# Patient Record
Sex: Male | Born: 1970 | Race: Black or African American | Hispanic: No | Marital: Married | Smoking: Never smoker
Health system: Southern US, Community
[De-identification: ages and names within clinical notes are randomized; demographics above are authoritative.]

## PROBLEM LIST (undated history)

## (undated) DIAGNOSIS — I10 Essential (primary) hypertension: Secondary | ICD-10-CM

## (undated) DIAGNOSIS — G4700 Insomnia, unspecified: Secondary | ICD-10-CM

## (undated) DIAGNOSIS — E119 Type 2 diabetes mellitus without complications: Secondary | ICD-10-CM

## (undated) DIAGNOSIS — G4733 Obstructive sleep apnea (adult) (pediatric): Principal | ICD-10-CM

## (undated) DIAGNOSIS — J45909 Unspecified asthma, uncomplicated: Secondary | ICD-10-CM

## (undated) HISTORY — DX: Insomnia, unspecified: G47.00

## (undated) HISTORY — DX: Obstructive sleep apnea (adult) (pediatric): G47.33

## (undated) HISTORY — DX: Essential (primary) hypertension: I10

## (undated) HISTORY — PX: VASECTOMY: SHX75

## (undated) HISTORY — DX: Type 2 diabetes mellitus without complications: E11.9

---

## 2012-09-30 ENCOUNTER — Emergency Department (HOSPITAL_BASED_OUTPATIENT_CLINIC_OR_DEPARTMENT_OTHER)
Admission: EM | Admit: 2012-09-30 | Discharge: 2012-09-30 | Disposition: A | Discharge status: Other Acute Inpatient Hospital | Payer: PRIVATE HEALTH INSURANCE | Attending: Emergency Medicine | Admitting: Emergency Medicine

## 2012-09-30 ENCOUNTER — Encounter (HOSPITAL_BASED_OUTPATIENT_CLINIC_OR_DEPARTMENT_OTHER): Payer: Self-pay | Admitting: *Deleted

## 2012-09-30 ENCOUNTER — Emergency Department (HOSPITAL_BASED_OUTPATIENT_CLINIC_OR_DEPARTMENT_OTHER): Payer: PRIVATE HEALTH INSURANCE

## 2012-09-30 DIAGNOSIS — R0602 Shortness of breath: Secondary | ICD-10-CM | POA: Insufficient documentation

## 2012-09-30 DIAGNOSIS — J189 Pneumonia, unspecified organism: Secondary | ICD-10-CM | POA: Insufficient documentation

## 2012-09-30 DIAGNOSIS — J3489 Other specified disorders of nose and nasal sinuses: Secondary | ICD-10-CM | POA: Insufficient documentation

## 2012-09-30 DIAGNOSIS — R0789 Other chest pain: Secondary | ICD-10-CM | POA: Insufficient documentation

## 2012-09-30 DIAGNOSIS — J45909 Unspecified asthma, uncomplicated: Secondary | ICD-10-CM | POA: Insufficient documentation

## 2012-09-30 DIAGNOSIS — R51 Headache: Secondary | ICD-10-CM | POA: Insufficient documentation

## 2012-09-30 DIAGNOSIS — Z79899 Other long term (current) drug therapy: Secondary | ICD-10-CM | POA: Insufficient documentation

## 2012-09-30 DIAGNOSIS — R509 Fever, unspecified: Secondary | ICD-10-CM | POA: Insufficient documentation

## 2012-09-30 DIAGNOSIS — J029 Acute pharyngitis, unspecified: Secondary | ICD-10-CM | POA: Insufficient documentation

## 2012-09-30 DIAGNOSIS — R6883 Chills (without fever): Secondary | ICD-10-CM | POA: Insufficient documentation

## 2012-09-30 HISTORY — DX: Unspecified asthma, uncomplicated: J45.909

## 2012-09-30 LAB — POCT I-STAT 3, ART BLOOD GAS (G3+)
Acid-base deficit: 1 mmol/L (ref 0.0–2.0)
O2 Saturation: 92 %
pCO2 arterial: 34.2 mmHg — ABNORMAL LOW (ref 35.0–45.0)

## 2012-09-30 LAB — BASIC METABOLIC PANEL
BUN: 9 mg/dL (ref 6–23)
Calcium: 9.7 mg/dL (ref 8.4–10.5)
GFR calc Af Amer: 84 mL/min — ABNORMAL LOW (ref >90)
GFR calc non Af Amer: 73 mL/min — ABNORMAL LOW (ref >90)
Potassium: 3.6 mEq/L (ref 3.5–5.1)
Sodium: 141 mEq/L (ref 135–145)

## 2012-09-30 LAB — CBC WITH DIFFERENTIAL/PLATELET
Basophils Relative: 0 % (ref 0–1)
Eosinophils Absolute: 0.1 10*3/uL (ref 0.0–0.7)
MCH: 28.9 pg (ref 26.0–34.0)
MCHC: 33.8 g/dL (ref 30.0–36.0)
Monocytes Relative: 11 % (ref 3–12)
Neutrophils Relative %: 75 % (ref 43–77)
Platelets: 218 10*3/uL (ref 150–400)

## 2012-09-30 LAB — URINALYSIS, ROUTINE W REFLEX MICROSCOPIC
Bilirubin Urine: NEGATIVE
Ketones, ur: 15 mg/dL — AB
Nitrite: NEGATIVE
Protein, ur: NEGATIVE mg/dL
Specific Gravity, Urine: 1.013 (ref 1.005–1.030)
Urobilinogen, UA: 0.2 mg/dL (ref 0.0–1.0)

## 2012-09-30 MED ORDER — DEXTROSE 5 % IV SOLN
1.0000 g | Freq: Once | INTRAVENOUS | Status: AC
  Administered 2012-09-30: 1 g via INTRAVENOUS
  Filled 2012-09-30: qty 10

## 2012-09-30 MED ORDER — DEXTROSE 5 % IV SOLN
500.0000 mg | Freq: Once | INTRAVENOUS | Status: AC
  Administered 2012-09-30: 500 mg via INTRAVENOUS
  Filled 2012-09-30: qty 500

## 2012-09-30 MED ORDER — ALBUTEROL SULFATE (5 MG/ML) 0.5% IN NEBU
5.0000 mg | INHALATION_SOLUTION | Freq: Once | RESPIRATORY_TRACT | Status: AC
  Administered 2012-09-30: 5 mg via RESPIRATORY_TRACT
  Filled 2012-09-30: qty 1

## 2012-09-30 MED ORDER — SODIUM CHLORIDE 0.9 % IV BOLUS (SEPSIS)
1000.0000 mL | Freq: Once | INTRAVENOUS | Status: AC
  Administered 2012-09-30: 1000 mL via INTRAVENOUS

## 2012-09-30 MED ORDER — IPRATROPIUM BROMIDE 0.02 % IN SOLN
0.5000 mg | Freq: Once | RESPIRATORY_TRACT | Status: AC
  Administered 2012-09-30: 0.5 mg via RESPIRATORY_TRACT
  Filled 2012-09-30: qty 2.5

## 2012-09-30 MED ORDER — PREDNISONE 50 MG PO TABS
60.0000 mg | ORAL_TABLET | Freq: Once | ORAL | Status: AC
  Administered 2012-09-30: 60 mg via ORAL
  Filled 2012-09-30: qty 1

## 2012-09-30 NOTE — ED Notes (Signed)
[facility]

## 2012-09-30 NOTE — ED Provider Notes (Signed)
History  This chart was scribed for Hilario Quarry, MD by Shari Heritage, ED Scribe. The patient was seen in room MH01/MH01. Patient's care was started at 1517.   CSN: [id number]  Arrival date & time 09/30/12  1501   First MD Initiated Contact with Patient 09/30/12 1517      Chief Complaint  Patient presents with  . Cough     Patient is a 42 y.o. male presenting with cough. The history is provided by the patient. No language interpreter was used.  Cough Cough characteristics:  Productive Sputum characteristics:  Yellow and green Severity:  Moderate Duration:  1 week Timing:  Constant Progression:  Unchanged Smoker: no   Relieved by:  Nothing Ineffective treatments:  Steroid inhaler and home nebulizer Associated symptoms: chills, fever (subjective), headaches, rhinorrhea, shortness of breath and sore throat      HPI Comments: Clifford Williamson is a 42 y.o. male with history of asthma who presents to the Emergency Department complaining of persistent moderate cough, shortness of breath and wheezing onset 1 week ago. Cough is productive of yellow-green sputum. There is associated sore throat, subjective fever, chills, headache, chest tightness and rhinorrhea.  He has an albuterol nebulizer at home which he used three times today prior to arrival. Patient also has inhalers at home and states that he usually uses his Qvar inhaler 2-3 times per day because of moderate to severe asthma symptoms. He denies history of COPD. He does not smoke. Patient states that he was last hospitalized in June 2013 for asthma exacerbation. He says that he only recently moved to [facility] from [place] and has never been admitted to area hospitals. Patient denies history of diabetes, DVT, PE, hypertension or high cholesterol.   PCP - [facility]   Past Medical History  Diagnosis Date  . Asthma     History reviewed. No pertinent past surgical history.  History reviewed. No pertinent family history.  History   Substance Use Topics  . Smoking status: Never Smoker   . Smokeless tobacco: Not on file  . Alcohol Use: No      Review of Systems  Constitutional: Positive for fever (subjective) and chills.  HENT: Positive for sore throat and rhinorrhea.   Respiratory: Positive for cough, chest tightness and shortness of breath.   Neurological: Positive for headaches.  All other systems reviewed and are negative.    Allergies  Aspirin  Home Medications   Current Outpatient Rx  Name  Route  Sig  Dispense  Refill  . albuterol (PROVENTIL HFA;VENTOLIN HFA) 108 (90 BASE) MCG/ACT inhaler   Inhalation   Inhale 2 puffs into the lungs every 6 (six) hours as needed for wheezing.         Marland Kitchen albuterol (PROVENTIL) (2.5 MG/3ML) 0.083% nebulizer solution   Nebulization   Take 2.5 mg by nebulization every 6 (six) hours as needed for wheezing.         . beclomethasone (QVAR) 80 MCG/ACT inhaler   Inhalation   Inhale 1 puff into the lungs as needed.         . budesonide-formoterol (SYMBICORT) 160-4.5 MCG/ACT inhaler   Inhalation   Inhale 2 puffs into the lungs 2 (two) times daily.         . mometasone-formoterol (DULERA) 100-5 MCG/ACT AERO   Inhalation   Inhale 2 puffs into the lungs.         . montelukast (SINGULAIR) 10 MG tablet   Oral   Take 10 mg by mouth  at bedtime.         Marland Kitchen zolpidem (AMBIEN) 10 MG tablet   Oral   Take 10 mg by mouth at bedtime as needed for sleep.           Triage Vitals: BP 125/87  Pulse 128  Temp(Src) 98.8 F (37.1 C) (Oral)  Resp 20  Ht 5' 6.5" (1.689 m)  Wt 187 lb (84.823 kg)  BMI 29.73 kg/m2  SpO2 93%  Physical Exam  Constitutional: He is oriented to person, place, and time. He appears well-developed and well-nourished.  HENT:  Head: Normocephalic and atraumatic.  Right Ear: Tympanic membrane, external ear and ear canal normal.  Left Ear: Tympanic membrane, external ear and ear canal normal.  Mouth/Throat: Oropharynx is clear and moist  and mucous membranes are normal. Mucous membranes are not dry.  Eyes: Conjunctivae and EOM are normal. Pupils are equal, round, and reactive to light.  Neck: Normal range of motion. Neck supple.  Cardiovascular: Regular rhythm and normal heart sounds.  Tachycardia present.   No murmur heard. Pulmonary/Chest: Effort normal. He has wheezes (throughout). He has rhonchi (throughout).  Abdominal: Soft.  Musculoskeletal: Normal range of motion.  Neurological: He is alert and oriented to person, place, and time.  Skin: Skin is warm and dry.    ED Course  Procedures (including critical care time) DIAGNOSTIC STUDIES: Oxygen Saturation is 93% on room air, adequate by my interpretation.    COORDINATION OF CARE: 3:29 PM- Will order albuterol neb 5 mg, Atrovent neb solution 0.5 mg, prednisone 60 mg and 1000 mL of IV fluids. Will also order chest x-Fateh Kindle, CBC with diff and BMP. Patient informed of current plan for treatment and evaluation and agrees with plan at this time.   4:17 PM- X-Chari Parmenter showed pneumonia and asthma exacerbation. Am recommending admission for further treatment. Will page hospitalist regarding patient's case.   Labs Reviewed  BASIC METABOLIC PANEL - Abnormal; Notable for the following:    GFR calc non Af Amer 73 (*)    GFR calc Af Amer 84 (*)    All other components within normal limits  CBC WITH DIFFERENTIAL  URINALYSIS, ROUTINE W REFLEX MICROSCOPIC     Dg Chest 2 View  09/30/2012  *RADIOLOGY REPORT*  Clinical Data: Asthma attack.  Cough, fever and chills.  CHEST - 2 VIEW  Comparison: None  Findings: Cardiac silhouette, mediastinal and hilar contours are normal.  There are mild bronchitic changes which could be due to reactive airways disease.  Ill-defined right lower lobe lung opacity could reflect a superimposed infiltrate.  IMPRESSION: Bronchitic type lung changes with suspected superimposed right lower lobe infiltrate.   Original Report Authenticated By: Rudie Meyer, M.D.       No diagnosis found.   Date: 09/30/2012  Rate: 129  Rhythm: sinus tachycardia  QRS Axis: normal  Intervals: normal  ST/T Wave abnormalities: normal  Conduction Disutrbances:none  Narrative Interpretation:   Old EKG Reviewed: unchanged     MDM   I personally performed the services described in this documentation, which was scribed in my presence. The recorded information has been reviewed and considered.   Patient given ns one liter, prednisone, albuterol neb x 2.  HR down to 108 , continues with expiratory wheezes and sats at 92% at rest.  Reviewed cxr significant for rll c.w. Cap.   I personally performed the services described in this documentation, which was scribed in my presence. The recorded information has been reviewed and considered. Discussed with Dr.  Spongberg and requested abg/  ABG done on 2 l n.c.  7.43/34/61 .  Patient accepted to medsurg bed.  Hilario Quarry, MD 09/30/12 [phone]

## 2012-09-30 NOTE — ED Notes (Signed)
Pt describes cough, fever, chills, runny nose, H/A x 1 week. Hx asthma and has used inhalers. Exp wheezing at triage.

## 2012-09-30 NOTE — ED Notes (Signed)
[facility] for admission to [facility]

## 2012-09-30 NOTE — ED Notes (Signed)
Called ems for transportaion to [facility]

## 2012-09-30 NOTE — ED Notes (Signed)
Called [facility] for tranportation to [facility] to room 700

## 2012-10-22 ENCOUNTER — Emergency Department (HOSPITAL_COMMUNITY)
Admission: EM | Admit: 2012-10-22 | Discharge: 2012-10-22 | Disposition: A | Discharge status: Home / Self Care | Payer: PRIVATE HEALTH INSURANCE | Attending: Emergency Medicine | Admitting: Emergency Medicine

## 2012-10-22 ENCOUNTER — Emergency Department (HOSPITAL_COMMUNITY): Payer: PRIVATE HEALTH INSURANCE

## 2012-10-22 ENCOUNTER — Encounter (HOSPITAL_COMMUNITY): Payer: Self-pay | Admitting: Emergency Medicine

## 2012-10-22 DIAGNOSIS — Y93I9 Activity, other involving external motion: Secondary | ICD-10-CM | POA: Insufficient documentation

## 2012-10-22 DIAGNOSIS — Z79899 Other long term (current) drug therapy: Secondary | ICD-10-CM | POA: Insufficient documentation

## 2012-10-22 DIAGNOSIS — S199XXA Unspecified injury of neck, initial encounter: Secondary | ICD-10-CM | POA: Insufficient documentation

## 2012-10-22 DIAGNOSIS — S0993XA Unspecified injury of face, initial encounter: Secondary | ICD-10-CM | POA: Insufficient documentation

## 2012-10-22 DIAGNOSIS — J45909 Unspecified asthma, uncomplicated: Secondary | ICD-10-CM | POA: Insufficient documentation

## 2012-10-22 DIAGNOSIS — S6990XA Unspecified injury of unspecified wrist, hand and finger(s), initial encounter: Secondary | ICD-10-CM | POA: Insufficient documentation

## 2012-10-22 DIAGNOSIS — Y92410 Unspecified street and highway as the place of occurrence of the external cause: Secondary | ICD-10-CM | POA: Insufficient documentation

## 2012-10-22 DIAGNOSIS — S139XXA Sprain of joints and ligaments of unspecified parts of neck, initial encounter: Secondary | ICD-10-CM | POA: Insufficient documentation

## 2012-10-22 DIAGNOSIS — S8010XA Contusion of unspecified lower leg, initial encounter: Secondary | ICD-10-CM | POA: Insufficient documentation

## 2012-10-22 DIAGNOSIS — S20219A Contusion of unspecified front wall of thorax, initial encounter: Secondary | ICD-10-CM | POA: Insufficient documentation

## 2012-10-22 DIAGNOSIS — S161XXA Strain of muscle, fascia and tendon at neck level, initial encounter: Secondary | ICD-10-CM

## 2012-10-22 DIAGNOSIS — S20212A Contusion of left front wall of thorax, initial encounter: Secondary | ICD-10-CM

## 2012-10-22 DIAGNOSIS — S8990XA Unspecified injury of unspecified lower leg, initial encounter: Secondary | ICD-10-CM | POA: Insufficient documentation

## 2012-10-22 DIAGNOSIS — S8012XA Contusion of left lower leg, initial encounter: Secondary | ICD-10-CM

## 2012-10-22 DIAGNOSIS — S99929A Unspecified injury of unspecified foot, initial encounter: Secondary | ICD-10-CM | POA: Insufficient documentation

## 2012-10-22 MED ORDER — CYCLOBENZAPRINE HCL 10 MG PO TABS: 10.0000 mg | ORAL_TABLET | Freq: Three times a day (TID) | ORAL | Status: DC | PRN

## 2012-10-22 MED ORDER — OXYCODONE-ACETAMINOPHEN 5-325 MG PO TABS
1.0000 | ORAL_TABLET | Freq: Once | ORAL | Status: AC
  Administered 2012-10-22: 1 via ORAL
  Filled 2012-10-22: qty 1

## 2012-10-22 MED ORDER — HYDROCODONE-ACETAMINOPHEN 5-325 MG PO TABS
1.0000 | ORAL_TABLET | Freq: Four times a day (QID) | ORAL | Status: DC | PRN
Start: 2012-10-22 — End: 2012-12-02

## 2012-10-22 MED ORDER — KETOROLAC TROMETHAMINE 60 MG/2ML IM SOLN
60.0000 mg | Freq: Once | INTRAMUSCULAR | Status: AC
  Administered 2012-10-22: 60 mg via INTRAMUSCULAR
  Filled 2012-10-22: qty 2

## 2012-10-22 MED ORDER — IBUPROFEN 800 MG PO TABS: 800.0000 mg | ORAL_TABLET | Freq: Three times a day (TID) | ORAL | Status: DC | PRN

## 2012-10-22 NOTE — ED Provider Notes (Signed)
Medical screening examination/treatment/procedure(s) were performed by non-physician practitioner and as supervising physician I was immediately available for consultation/collaboration.   Glynn Octave, MD 10/22/12 1451

## 2012-10-22 NOTE — ED Notes (Signed)
LSB removed  

## 2012-10-22 NOTE — ED Notes (Signed)
Returned from xray

## 2012-10-22 NOTE — ED Notes (Signed)
C/O left hand pain with small, superficial laceration and left lower leg pain. No deformity noted.
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
## 2012-12-02 ENCOUNTER — Encounter (HOSPITAL_BASED_OUTPATIENT_CLINIC_OR_DEPARTMENT_OTHER): Payer: Self-pay | Admitting: Emergency Medicine

## 2012-12-02 ENCOUNTER — Emergency Department (HOSPITAL_BASED_OUTPATIENT_CLINIC_OR_DEPARTMENT_OTHER)
Admission: EM | Admit: 2012-12-02 | Discharge: 2012-12-02 | Disposition: A | Discharge status: Other Acute Inpatient Hospital | Payer: PRIVATE HEALTH INSURANCE | Attending: Emergency Medicine | Admitting: Emergency Medicine

## 2012-12-02 ENCOUNTER — Emergency Department (HOSPITAL_BASED_OUTPATIENT_CLINIC_OR_DEPARTMENT_OTHER): Payer: PRIVATE HEALTH INSURANCE

## 2012-12-02 DIAGNOSIS — R079 Chest pain, unspecified: Secondary | ICD-10-CM | POA: Insufficient documentation

## 2012-12-02 DIAGNOSIS — J3489 Other specified disorders of nose and nasal sinuses: Secondary | ICD-10-CM | POA: Insufficient documentation

## 2012-12-02 DIAGNOSIS — Z79899 Other long term (current) drug therapy: Secondary | ICD-10-CM | POA: Insufficient documentation

## 2012-12-02 DIAGNOSIS — J45902 Unspecified asthma with status asthmaticus: Secondary | ICD-10-CM

## 2012-12-02 DIAGNOSIS — R05 Cough: Secondary | ICD-10-CM | POA: Insufficient documentation

## 2012-12-02 DIAGNOSIS — R6883 Chills (without fever): Secondary | ICD-10-CM | POA: Insufficient documentation

## 2012-12-02 DIAGNOSIS — M7989 Other specified soft tissue disorders: Secondary | ICD-10-CM | POA: Insufficient documentation

## 2012-12-02 DIAGNOSIS — R51 Headache: Secondary | ICD-10-CM | POA: Insufficient documentation

## 2012-12-02 DIAGNOSIS — R059 Cough, unspecified: Secondary | ICD-10-CM | POA: Insufficient documentation

## 2012-12-02 DIAGNOSIS — J029 Acute pharyngitis, unspecified: Secondary | ICD-10-CM | POA: Insufficient documentation

## 2012-12-02 DIAGNOSIS — J45901 Unspecified asthma with (acute) exacerbation: Secondary | ICD-10-CM | POA: Insufficient documentation

## 2012-12-02 LAB — CBC WITH DIFFERENTIAL/PLATELET
Basophils Absolute: 0 10*3/uL (ref 0.0–0.1)
HCT: 40.8 % (ref 39.0–52.0)
Lymphocytes Relative: 14 % (ref 12–46)
Monocytes Absolute: 0.7 10*3/uL (ref 0.1–1.0)
Neutro Abs: 6.6 10*3/uL (ref 1.7–7.7)
RDW: 13 % (ref 11.5–15.5)
WBC: 8.8 10*3/uL (ref 4.0–10.5)

## 2012-12-02 LAB — BASIC METABOLIC PANEL
CO2: 26 mEq/L (ref 19–32)
Chloride: 104 mEq/L (ref 96–112)
Creatinine, Ser: 1.4 mg/dL — ABNORMAL HIGH (ref 0.50–1.35)
Potassium: 3.4 mEq/L — ABNORMAL LOW (ref 3.5–5.1)
Sodium: 144 mEq/L (ref 135–145)

## 2012-12-02 MED ORDER — ALBUTEROL SULFATE (5 MG/ML) 0.5% IN NEBU
INHALATION_SOLUTION | RESPIRATORY_TRACT | Status: AC
  Filled 2012-12-02: qty 0.5

## 2012-12-02 MED ORDER — ALBUTEROL SULFATE (5 MG/ML) 0.5% IN NEBU
5.0000 mg | INHALATION_SOLUTION | Freq: Once | RESPIRATORY_TRACT | Status: AC
  Administered 2012-12-02: 5 mg via RESPIRATORY_TRACT
  Filled 2012-12-02: qty 1

## 2012-12-02 MED ORDER — PREDNISONE 50 MG PO TABS
60.0000 mg | ORAL_TABLET | Freq: Once | ORAL | Status: AC
  Administered 2012-12-02: 60 mg via ORAL
  Filled 2012-12-02: qty 1

## 2012-12-02 MED ORDER — IPRATROPIUM BROMIDE 0.02 % IN SOLN
0.5000 mg | Freq: Once | RESPIRATORY_TRACT | Status: AC
  Administered 2012-12-02: 0.5 mg via RESPIRATORY_TRACT
  Filled 2012-12-02: qty 2.5

## 2012-12-02 MED ORDER — ALBUTEROL SULFATE (5 MG/ML) 0.5% IN NEBU: 15.0000 mg | INHALATION_SOLUTION | Freq: Once | RESPIRATORY_TRACT | Status: AC

## 2012-12-02 MED ORDER — SODIUM CHLORIDE 0.9 % IV BOLUS (SEPSIS)
500.0000 mL | Freq: Once | INTRAVENOUS | Status: AC
  Administered 2012-12-02: 500 mL via INTRAVENOUS

## 2012-12-02 NOTE — ED Notes (Signed)
Attempt to call report to [facility] at [facility] x2, placed on hold, no answer. Will return call.

## 2012-12-02 NOTE — ED Provider Notes (Addendum)
History    This chart was scribed for Clifford Quarry, MD by Donne Anon, ED Scribe. This patient was seen in room MH11/MH11 and the patient's care was started at 1735.   CSN: [id number]  Arrival date & time 12/02/12  1722   First MD Initiated Contact with Patient 12/02/12 1735      Chief Complaint  Patient presents with  . Shortness of Breath  . Chest Pain  . Asthma     The history is provided by the patient. No language interpreter was used.   Clifford Williamson is a 42 y.o. male with h/o asthma who presents to the Emergency Department complaining of several days of gradual onset, gradually worsening, productive cough with yellow sputum with associated nasal congestion, chills, sore throat, HA, and SOB. He states he used his Albuterol inhaler until it ran out today. He used his nebulizer machine 3 times today with mild relief. He was been hospitalized overnight for his asthma but has never been on a ventilator. He was last on steroids in February. He states he has baseline left leg swelling.  His PCP is Dr. Yetta Barre at [facility].   Past Medical History  Diagnosis Date  . Asthma     Past Surgical History  Procedure Laterality Date  . Vasectomy      No family history on file.  History  Substance Use Topics  . Smoking status: Never Smoker   . Smokeless tobacco: Not on file  . Alcohol Use: No      Review of Systems  Constitutional: Positive for chills. Negative for fever.  HENT: Positive for congestion and sore throat.   Respiratory: Positive for cough and shortness of breath.   Cardiovascular: Positive for leg swelling.  Neurological: Positive for headaches.  All other systems reviewed and are negative.  Patient admitted 2/14 for asthma and cap  Allergies  Aspirin  Home Medications   Current Outpatient Rx  Name  Route  Sig  Dispense  Refill  . albuterol (PROVENTIL) (2.5 MG/3ML) 0.083% nebulizer solution   Nebulization   Take 2.5 mg by nebulization every 6 (six)  hours as needed for wheezing.         . beclomethasone (QVAR) 80 MCG/ACT inhaler   Inhalation   Inhale 1 puff into the lungs as needed.         . budesonide-formoterol (SYMBICORT) 160-4.5 MCG/ACT inhaler   Inhalation   Inhale 2 puffs into the lungs 2 (two) times daily.         . cyclobenzaprine (FLEXERIL) 10 MG tablet   Oral   Take 1 tablet (10 mg total) by mouth 3 (three) times daily as needed for muscle spasms.   15 tablet   0   . diphenhydrAMINE (BENADRYL) 25 MG tablet   Oral   Take 25 mg by mouth every 8 (eight) hours as needed for itching.         Marland Kitchen HYDROcodone-acetaminophen (NORCO/VICODIN) 5-325 MG per tablet   Oral   Take 1 tablet by mouth every 6 (six) hours as needed for pain.   15 tablet   0   . ibuprofen (ADVIL,MOTRIN) 800 MG tablet   Oral   Take 1 tablet (800 mg total) by mouth every 8 (eight) hours as needed for pain.   21 tablet   0   . montelukast (SINGULAIR) 10 MG tablet   Oral   Take 10 mg by mouth at bedtime.         Marland Kitchen  zolpidem (AMBIEN) 10 MG tablet   Oral   Take 10 mg by mouth at bedtime as needed for sleep.           Triage Vitals; BP 128/92  Pulse 124  Temp(Src) 98.2 F (36.8 C) (Oral)  Resp 18  Ht 5\' 6"  (1.676 m)  Wt 189 lb (85.73 kg)  BMI 30.52 kg/m2  SpO2 94%  Physical Exam  Nursing note and vitals reviewed. Constitutional: He is oriented to person, place, and time. He appears well-developed and well-nourished. No distress.  Compression stockings on left leg up to left knee.  HENT:  Head: Normocephalic and atraumatic.  Eyes: EOM are normal.  Neck: Neck supple. No tracheal deviation present.  Cardiovascular: Normal heart sounds.  Tachycardia present.   Pulmonary/Chest: He has wheezes. He has rhonchi.  Musculoskeletal: Normal range of motion.  Neurological: He is alert and oriented to person, place, and time.  Skin: Skin is warm and dry.  Psychiatric: He has a normal mood and affect. His behavior is normal.    ED  Course  Procedures (including critical care time) DIAGNOSTIC STUDIES: Oxygen Saturation is 94% on room air, low by my interpretation.    COORDINATION OF CARE: 5:38 PM Discussed treatment plan which includes a breathing treatment with pt at bedside and pt agreed to plan.   Date: 12/02/2012  Rate: 126  Rhythm: sinus tachycardia  QRS Axis: right  Intervals: normal  ST/T Wave abnormalities: normal  Conduction Disutrbances:none  Narrative Interpretation:   Old EKG Reviewed: unchanged from 2/14     Labs Reviewed - No data to display No results found.   No diagnosis found. Dg Chest 2 View  12/02/2012  *RADIOLOGY REPORT*  Clinical Data: Cough.  CHEST - 2 VIEW  Comparison: Chest x-Kristin Barcus 10/22/2012.  Findings: Lung volumes are normal.  No consolidative airspace disease.  No pleural effusions.  No pneumothorax.  No pulmonary nodule or mass noted.  Pulmonary vasculature and the cardiomediastinal silhouette are within normal limits.  IMPRESSION: 1. No radiographic evidence of acute cardiopulmonary disease.   Original Report Authenticated By: Trudie Reed, M.D.     Date: 12/02/2012  Rate: 126  Rhythm: sinus tachycardia  QRS Axis: normal  Intervals: normal  ST/T Wave abnormalities: normal  Conduction Disutrbances:none  Narrative Interpretation:   Old EKG Reviewed: none available    MDM  7:13 PM Patient continues tachypneic with tachycardia and sats at 88% on room air.  Patient on 15 mg neb.  He has had prednisone.    1950 Patient continues tachypneic, tachycardiac, sats 95% on nasal cannula- discussed need for admission with patient and wife.  8:05 PM Discussed transfer with Dr. Eden Emms and patient to be sent to tele bed and [facility].   I personally performed the services described in this documentation, which was scribed in my presence. The recorded information has been reviewed and considered.   CRITICAL CARE Performed by: Clifford Williamson   Total critical care time:  45  Critical care time was exclusive of separately billable procedures and treating other patients.  Critical care was necessary to treat or prevent imminent or life-threatening deterioration.  Critical care was time spent personally by me on the following activities: development of treatment plan with patient and/or surrogate as well as nursing, discussions with consultants, evaluation of patient's response to treatment, examination of patient, obtaining history from patient or surrogate, ordering and performing treatments and interventions, ordering and review of laboratory studies, ordering and review of radiographic studies, pulse oximetry and  re-evaluation of patient's condition.      Clifford Quarry, MD 12/02/12 2005  Clifford Quarry, MD 12/02/12 2006  Clifford Quarry, MD 12/02/12 2017

## 2012-12-02 NOTE — ED Notes (Signed)
Vital signs stable. 

## 2012-12-02 NOTE — ED Notes (Signed)
Assigned to bed 724 @ [facility] per nursing supervisor Angelique Blonder, RN notified, [facility] called for transport.

## 2012-12-02 NOTE — ED Notes (Signed)
Pt having cold symptoms for several days.  Pt has asthma which has worsened.  Productive cough, yellow sputum, nasal congestion.  Some chills and sore throat.  Sob worse today.  Some headache and chest tightness with cough.

## 2012-12-13 ENCOUNTER — Telehealth: Payer: Self-pay | Admitting: *Deleted

## 2012-12-13 NOTE — Telephone Encounter (Signed)
Message left that we received a referral from Zoe Lan, NP-C.  Please call to schedule a Sleep Consult with Dr. Vickey Huger

## 2013-01-02 ENCOUNTER — Encounter: Payer: Self-pay | Admitting: Pulmonary Disease

## 2013-01-03 ENCOUNTER — Institutional Professional Consult (permissible substitution): Payer: PRIVATE HEALTH INSURANCE | Admitting: Pulmonary Disease

## 2013-01-08 ENCOUNTER — Institutional Professional Consult (permissible substitution): Payer: Self-pay | Admitting: Neurology

## 2013-01-09 ENCOUNTER — Ambulatory Visit (INDEPENDENT_AMBULATORY_CARE_PROVIDER_SITE_OTHER): Payer: PRIVATE HEALTH INSURANCE | Admitting: Neurology

## 2013-01-09 ENCOUNTER — Encounter: Payer: Self-pay | Admitting: Neurology

## 2013-01-09 VITALS — BP 114/67 | HR 71 | Temp 97.6°F | Ht 66.0 in | Wt 193.0 lb

## 2013-01-09 DIAGNOSIS — G4733 Obstructive sleep apnea (adult) (pediatric): Secondary | ICD-10-CM | POA: Insufficient documentation

## 2013-01-09 HISTORY — DX: Obstructive sleep apnea (adult) (pediatric): G47.33

## 2013-01-09 NOTE — Progress Notes (Signed)
Subjective:    Patient ID: Clifford Williamson is a 42 y.o. male.  HPI  Clifford Foley, MD, PhD [facility] [address] [address] [place], [place] [address]  Dear Clifford Williamson,  I saw your patient, Clifford Williamson, upon your kind request in my neurologic clinic today for initial consultation of his sleep disorder, in particular concern for obstructive sleep apnea. The patient is unaccompanied today. As you know, Clifford Williamson is a 42 year old right-handed gentleman with an underlying medical history of asthma, diabetes, and obesity who has been noted to snore. Witnessed apneas are reported as well as well as choking sensations.  He works from [place] to 8 am and goes to bed at 9:30 to 10AM on those days. He works 4-5 days per week. He falls asleep generally between 20-30 minutes and sleeps till 2-2:30 PM and then takes a nap in the evening before he goes to work. He sleeps during the night on those other days. He has been on Ambien for 5 years approximately.   He reports feeling not well rested upon awakening. He wakes up on an average 1 in the middle of the night and has to go to the bathroom 1 times on a typical night. He denies morning headaches.  He reports excessive daytime somnolence (EDS) and His Epworth Sleepiness Score (ESS) is 11/24 today. He has not fallen asleep while driving. He does report a sense of zoning out sometimes, no history of Sz, no FHx of epilepsy. He feels like he is in a daze sometimes, when sedentary, lasting for a couple minutes.   He has been known to snore for the past many years. Snoring is reportedly marked, and associated with choking sounds and witnessed apneas. The patient denies a sense of choking or strangling feeling. There is no report of nighttime reflux, with no nighttime cough experienced. The patient has not noted any RLS symptoms and is not known to kick while asleep or before falling asleep. He is not a very restless sleeper.   He denies  cataplexy, sleep paralysis, hypnagogic or hypnopompic hallucinations, or sleep attacks. He does not report any vivid dreams, nightmares, dream enactments, or parasomnias, such as sleep talking or sleep walking. The patient has not had a sleep study or a home sleep test.  He consumes 3 caffeinated beverage per day, usually in the form of coffee.  He had used CPAP in the hospital years ago while in [place].   His Past Medical History Is Significant For: Past Medical History  Diagnosis Date  . Asthma   . DM (diabetes mellitus)   . Hypertension   . Insomnia     His Past Surgical History Is Significant For: Past Surgical History  Procedure Laterality Date  . Vasectomy      His Family History Is Significant For: Family History  Problem Relation Age of Onset  . Diabetes Mother   . Hypertension Mother     His Social History Is Significant For: History   Social History  . Marital Status: Married    Spouse Name: N/A    Number of Children: N/A  . Years of Education: N/A   Social History Main Topics  . Smoking status: Never Smoker   . Smokeless tobacco: Never Used  . Alcohol Use: No  . Drug Use: No  . Sexually Active: None   Other Topics Concern  . None   Social History Narrative  . None    His Allergies Are:  Allergies  Allergen Reactions  . Aspirin Other (See Comments)    Triggers asthma  . Nsaids   :   His Current Medications Are:  Outpatient Encounter Prescriptions as of 01/09/2013  Medication Sig Dispense Refill  . albuterol (PROVENTIL HFA;VENTOLIN HFA) 108 (90 BASE) MCG/ACT inhaler Inhale 2 puffs into the lungs every 6 (six) hours as needed for wheezing.      Marland Kitchen albuterol (PROVENTIL) (2.5 MG/3ML) 0.083% nebulizer solution Take 2.5 mg by nebulization every 6 (six) hours as needed for wheezing.      Marland Kitchen AUVI-Q 0.3 MG/0.3ML SOAJ       . Azelastine-Fluticasone 137-50 MCG/ACT SUSP Place 1 puff into the nose daily.      . beclomethasone (QVAR) 80 MCG/ACT inhaler Inhale 1  puff into the lungs as needed.      . benzonatate (TESSALON) 100 MG capsule Take 100 mg by mouth 3 (three) times daily as needed for cough.      . Blood Glucose Monitoring Suppl (FREESTYLE LITE) DEVI       . budesonide-formoterol (SYMBICORT) 160-4.5 MCG/ACT inhaler Inhale 2 puffs into the lungs 2 (two) times daily.      . cetirizine (ZYRTEC) 10 MG tablet Take 10 mg by mouth daily.      . diphenhydrAMINE (BENADRYL) 25 MG tablet Take 25 mg by mouth every 8 (eight) hours as needed for itching.      . DULERA 200-5 MCG/ACT AERO       . FREESTYLE LITE test strip       . montelukast (SINGULAIR) 10 MG tablet Take 10 mg by mouth at bedtime.      Marland Kitchen nystatin (MYCOSTATIN) 100000 UNIT/ML suspension       . Pharmacist Choice Lancets MISC       . tolterodine (DETROL LA) 4 MG 24 hr capsule Take 4 mg by mouth daily.      Marland Kitchen zolpidem (AMBIEN) 10 MG tablet Take 10 mg by mouth at bedtime as needed for sleep.      . [DISCONTINUED] methylPREDNIsolone (MEDROL DOSPACK) 4 MG tablet        No facility-administered encounter medications on file as of 01/09/2013.   Review of Systems  Respiratory: Positive for shortness of breath and wheezing.        Snoring  Cardiovascular: Positive for leg swelling.  Allergic/Immunologic: Positive for environmental allergies.    Objective:  Neurologic Exam  Physical Exam Physical Examination:   Filed Vitals:   01/09/13 1006  BP: 114/67  Pulse: 71  Temp: 97.6 F (36.4 C)    General Examination: The patient is a very pleasant 42 y.o. male in no acute distress. He appears well-developed and well-nourished and adequately groomed.   HEENT: Normocephalic, atraumatic, pupils are equal, round and reactive to light and accommodation. Funduscopic exam is normal with sharp disc margins noted. Extraocular tracking is good without limitation to gaze excursion or nystagmus noted. Normal smooth pursuit is noted. Hearing is grossly intact. Tympanic membranes are clear bilaterally. Face is  symmetric with normal facial animation and normal facial sensation. Speech is clear with no dysarthria noted. There is no hypophonia. There is no lip, neck/head, jaw or voice tremor. Neck is supple with full range of passive and active motion. There are no carotid bruits on auscultation. Oropharynx exam reveals: mild mouth dryness, adequate dental hygiene and moderate airway crowding, due to elongated tongue and uvula and narrow airway entry. Unable to visualize tonsils. Mallampati is class II. Tongue protrudes centrally and palate elevates symmetrically.  Neck size is 17.5 inches.   Chest: Clear to auscultation without wheezing, rhonchi or crackles noted.  Heart: S1+S2+0, regular and normal without murmurs, rubs or gallops noted.   Abdomen: Soft, non-tender and non-distended with normal bowel sounds appreciated on auscultation.  Extremities: There is trace pitting edema in the distal lower L extremity and he wears a compression hose on the L. He had a history of phlebitis on that side. No Hx of DVT.   Skin: Warm and dry without trophic changes noted. There are no varicose veins.  Musculoskeletal: exam reveals no obvious joint deformities, tenderness or joint swelling or erythema.   Neurologically:  Mental status: The patient is awake, alert and oriented in all 4 spheres. His memory, attention, language and knowledge are appropriate. There is no aphasia, agnosia, apraxia or anomia. Speech is clear with normal prosody and enunciation. Thought process is linear. Mood is congruent and affect is normal.  Cranial nerves are as described above under HEENT exam. In addition, shoulder shrug is normal with equal shoulder height noted. Motor exam: Normal bulk, strength and tone is noted. There is no drift, tremor or rebound. Romberg is negative. Reflexes are 2+ throughout. Toes are downgoing bilaterally. Fine motor skills are intact with normal finger taps, normal hand movements, normal rapid alternating  patting, normal foot taps and normal foot agility.  Cerebellar testing shows no dysmetria or intention tremor on finger to nose testing. Heel to shin is unremarkable bilaterally. There is no truncal or gait ataxia.  Sensory exam is intact to light touch, pinprick, vibration, temperature sense and proprioception in the upper and lower extremities.  Gait, station and balance are unremarkable. No veering to one side is noted. No leaning to one side is noted. Posture is age-appropriate and stance is narrow based. No problems turning are noted. He turns en bloc. Tandem walk is unremarkable. Intact toe and heel stance is noted.               Assessment and Plan:   In summary, Clifford Williamson is a very pleasant 42 y.o.-year old male with a history and physical exam concerning for obstructive sleep apnea (OSA). I had a long chat with the patient about my findings and the diagnosis, its prognosis and treatment options. We talked about medical treatments and non-pharmacological approaches. I explained in particular the risks and ramifications of untreated moderate to severe OSA, especially with respect to developing cardiovascular disease down the Road, including congestive heart failure, difficult to treat hypertension, cardiac arrhythmias, or stroke. Even type 2 diabetes has in part been linked to untreated OSA. We talked about trying to maintain a healthy lifestyle in general, as well as the importance of weight control. I encouraged the patient to eat healthy, exercise daily and keep well hydrated, to keep a scheduled bedtime and wake time routine, to not skip any meals and eat healthy snacks in between meals.  I recommended the following at this time: sleep study.  I explained the sleep test procedure to the patient and also outlined surgical and non-surgical treatment options of OSA including the use of a dental custom-made appliance, upper airway surgery such as pillar implants, radiofrequency surgery, tongue  base surgery, and UPPP. I also explained the CPAP treatment option to the patient, who indicated that he would be willing to try CPAP if the need arises. I explained the importance of being compliant with PAP treatment, not only for insurance purposes but primarily for the patient's long term health benefit. I  answered all his questions today and the patient was in agreement. I would like to see him back after the sleep study.  Thank you very much for allowing me to participate in the care of this nice patient. If I can be of any further assistance to you please do not hesitate to call me at [phone].  Sincerely,   Clifford Foley, MD, PhD

## 2013-01-09 NOTE — Patient Instructions (Addendum)

## 2013-08-26 DIAGNOSIS — J309 Allergic rhinitis, unspecified: Secondary | ICD-10-CM | POA: Insufficient documentation

## 2013-10-31 DIAGNOSIS — J45909 Unspecified asthma, uncomplicated: Secondary | ICD-10-CM | POA: Insufficient documentation

## 2013-12-05 DIAGNOSIS — E119 Type 2 diabetes mellitus without complications: Secondary | ICD-10-CM | POA: Insufficient documentation

## 2014-02-12 DIAGNOSIS — N400 Enlarged prostate without lower urinary tract symptoms: Secondary | ICD-10-CM | POA: Insufficient documentation

## 2014-02-12 DIAGNOSIS — N529 Male erectile dysfunction, unspecified: Secondary | ICD-10-CM | POA: Insufficient documentation

## 2014-02-12 DIAGNOSIS — R339 Retention of urine, unspecified: Secondary | ICD-10-CM | POA: Insufficient documentation
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
## 2015-04-05 DIAGNOSIS — J45901 Unspecified asthma with (acute) exacerbation: Secondary | ICD-10-CM | POA: Insufficient documentation

## 2016-01-19 DIAGNOSIS — G4726 Circadian rhythm sleep disorder, shift work type: Secondary | ICD-10-CM | POA: Insufficient documentation

## 2016-01-19 DIAGNOSIS — G4719 Other hypersomnia: Secondary | ICD-10-CM | POA: Insufficient documentation

## 2016-01-19 DIAGNOSIS — G4701 Insomnia due to medical condition: Secondary | ICD-10-CM | POA: Insufficient documentation

## 2016-05-04 DIAGNOSIS — B9789 Other viral agents as the cause of diseases classified elsewhere: Secondary | ICD-10-CM

## 2016-05-04 DIAGNOSIS — J339 Nasal polyp, unspecified: Secondary | ICD-10-CM | POA: Insufficient documentation

## 2016-05-04 DIAGNOSIS — J069 Acute upper respiratory infection, unspecified: Secondary | ICD-10-CM | POA: Insufficient documentation

## 2016-06-09 DIAGNOSIS — J4550 Severe persistent asthma, uncomplicated: Secondary | ICD-10-CM | POA: Insufficient documentation

## 2016-06-09 DIAGNOSIS — T380X5A Adverse effect of glucocorticoids and synthetic analogues, initial encounter: Secondary | ICD-10-CM

## 2016-06-09 DIAGNOSIS — J189 Pneumonia, unspecified organism: Secondary | ICD-10-CM | POA: Insufficient documentation

## 2016-06-09 DIAGNOSIS — R739 Hyperglycemia, unspecified: Secondary | ICD-10-CM | POA: Insufficient documentation

## 2016-06-12 DIAGNOSIS — T39015A Adverse effect of aspirin, initial encounter: Secondary | ICD-10-CM

## 2016-06-12 DIAGNOSIS — Z888 Allergy status to other drugs, medicaments and biological substances status: Secondary | ICD-10-CM | POA: Insufficient documentation

## 2016-06-12 DIAGNOSIS — J984 Other disorders of lung: Secondary | ICD-10-CM | POA: Insufficient documentation

## 2016-06-12 DIAGNOSIS — J339 Nasal polyp, unspecified: Secondary | ICD-10-CM | POA: Insufficient documentation

## 2016-08-24 ENCOUNTER — Ambulatory Visit: Payer: PRIVATE HEALTH INSURANCE | Admitting: Allergy and Immunology

## 2016-09-07 ENCOUNTER — Encounter: Payer: Self-pay | Admitting: Allergy and Immunology

## 2016-09-07 ENCOUNTER — Ambulatory Visit (INDEPENDENT_AMBULATORY_CARE_PROVIDER_SITE_OTHER): Payer: BLUE CROSS/BLUE SHIELD | Admitting: Allergy and Immunology

## 2016-09-07 VITALS — BP 108/76 | HR 100 | Temp 97.7°F | Resp 18 | Ht 66.5 in | Wt 205.0 lb

## 2016-09-07 DIAGNOSIS — J309 Allergic rhinitis, unspecified: Secondary | ICD-10-CM

## 2016-09-07 DIAGNOSIS — J45901 Unspecified asthma with (acute) exacerbation: Secondary | ICD-10-CM

## 2016-09-07 MED ORDER — AZELASTINE HCL 0.15 % NA SOLN: 2.0000 | Freq: Two times a day (BID) | NASAL | 5 refills | Status: DC

## 2016-09-07 MED ORDER — PREDNISONE 1 MG PO TABS: 10.0000 mg | ORAL_TABLET | Freq: Every day | ORAL | Status: DC

## 2016-09-07 MED ORDER — FLUTICASONE-SALMETEROL 230-21 MCG/ACT IN AERO: 2.0000 | INHALATION_SPRAY | Freq: Two times a day (BID) | RESPIRATORY_TRACT | 5 refills | Status: DC

## 2016-09-07 NOTE — Patient Instructions (Addendum)
Asthma with acute exacerbation Severe persistent asthma poorly controlled despite multiple medications.  Prednisone has been provided, 40 mg x3 days, 20 mg x1 day, 10 mg x1 day, then stop.  A lab order form has been provided for total IgE and CBC with differential to assess candidacy for omalizumab or benralizumab.  As his insurance no longer covers Dulera, a prescription has been provided for Advair 230/21 g, 2 inhalations twice a day.  To maximize pulmonary deposition, a spacer has been provided along with instructions for its proper administration with an HFA inhaler.  Continue Spiriva Respimat, montelukast 10 mg daily, and albuterol HFA every 4-6 hours as needed.  The patient has been asked to contact me if his symptoms persist or progress. Otherwise, he may return for follow up in 2 weeks for allergy skin testing.  Allergic rhinitis We were unable to perform skin tests today due to recent administration of antihistamine.   A prescription has been provided for azelastine nasal spray, 1-2 sprays per nostril 2 times daily as needed. Proper nasal spray technique has been discussed and demonstrated.   I have also recommended nasal saline spray (i.e., Simply Saline) or nasal saline lavage (i.e., NeilMed) as needed prior to medicated nasal sprays.  Continue montelukast (as above).  The patient is scheduled to return in the next 2 weeks for allergy skin testing after having been off of antihistamines for at least 3 days. Based on skin test results, we will proceed with mixing immunotherapy vials.   Return in about 2 weeks (around 09/21/2016) for allergy skin testing.

## 2016-09-07 NOTE — Assessment & Plan Note (Addendum)
Severe persistent asthma poorly controlled despite multiple medications.  Prednisone has been provided, 40 mg x3 days, 20 mg x1 day, 10 mg x1 day, then stop.  A lab order form has been provided for total IgE and CBC with differential to assess candidacy for omalizumab or benralizumab.  As his insurance no longer covers Dulera, a prescription has been provided for Advair 230/21 g, 2 inhalations twice a day.  To maximize pulmonary deposition, a spacer has been provided along with instructions for its proper administration with an HFA inhaler.  Continue Spiriva Respimat, montelukast 10 mg daily, and albuterol HFA every 4-6 hours as needed.  The patient has been asked to contact me if his symptoms persist or progress. Otherwise, he may return for follow up in 2 weeks for allergy skin testing.

## 2016-09-07 NOTE — Progress Notes (Signed)
New Patient Note  RE: Clifford Williamson MRN: [id number] DOB: 06-Jun-1971 Date of Office Visit: 09/07/2016  Referring provider: No ref. provider found Primary care provider: Pcp Not In System  Chief Complaint: Asthma; Breathing Problem; Cough; Wheezing; and Allergic Rhinitis    History of present illness: Clifford Williamson is a 46 y.o. male presenting today for evaluation of asthma and rhinitis.  He has a history of severe persistent asthma.  He reports that he has been hospitalized 3 or 4 times over the past 5 years for asthma exacerbations.  His most recent asthma exacerbation requiring hospitalization occurred in November in which he was hospitalized for 3 or 4 days and required BiPAP.  He continues to experience frequent asthma symptoms despite compliance with Dulera 200 asked 5 g, 2 inhalations twice a day, Spiriva Respimat, and montelukast 10 mg daily.  He currently does not use a spacer device with HFA inhalers.  In addition, he adds Qvar as burst therapy when needed.  He had been on Xolair in the past with benefit, however his last Xolair injection was approximately 3 or 4 years ago.  In addition, he had been on aeroallergen immunotherapy for allergic rhinitis with benefit.  He has not had immunotherapy injections over the past 3 or 4 years but is interested in restarting in an attempt to control his persistent nasal/sinus symptoms, including nasal congestion, postnasal drainage, and sinus pressure.  He typically has 3 or 4 sinus infections requiring antibiotics and/or corticosteroids per year on average.   Assessment and plan: Asthma with acute exacerbation Severe persistent asthma poorly controlled despite multiple medications.  Prednisone has been provided, 40 mg x3 days, 20 mg x1 day, 10 mg x1 day, then stop.  A lab order form has been provided for total IgE and CBC with differential to assess candidacy for omalizumab or benralizumab.  As his insurance no longer covers Dulera, a  prescription has been provided for Advair 230/21 g, 2 inhalations twice a day.  To maximize pulmonary deposition, a spacer has been provided along with instructions for its proper administration with an HFA inhaler.  Continue Spiriva Respimat, montelukast 10 mg daily, and albuterol HFA every 4-6 hours as needed.  The patient has been asked to contact me if his symptoms persist or progress. Otherwise, he may return for follow up in 2 weeks for allergy skin testing.  Allergic rhinitis We were unable to perform skin tests today due to recent administration of antihistamine.   A prescription has been provided for azelastine nasal spray, 1-2 sprays per nostril 2 times daily as needed. Proper nasal spray technique has been discussed and demonstrated.   I have also recommended nasal saline spray (i.e., Simply Saline) or nasal saline lavage (i.e., NeilMed) as needed prior to medicated nasal sprays.  Continue montelukast (as above).  The patient is scheduled to return in the next 2 weeks for allergy skin testing after having been off of antihistamines for at least 3 days. Based on skin test results, we will proceed with mixing immunotherapy vials.   Meds ordered this encounter  Medications  . fluticasone-salmeterol (ADVAIR HFA) 230-21 MCG/ACT inhaler    Sig: Inhale 2 puffs into the lungs 2 (two) times daily.    Dispense:  1 Inhaler    Refill:  5  . Azelastine HCl 0.15 % SOLN    Sig: Place 2 sprays into both nostrils 2 (two) times daily.    Dispense:  30 mL    Refill:  5  .  predniSONE (DELTASONE) tablet 10 mg    Diagnostics: Spirometry: FVC was 1.49 L (39% predicted) and FEV1 was 0.90 L (29% predicted) with significant (600 mL, 67%) postbronchodilator improvement.  Please see scanned spirometry results for details.    Physical examination: Blood pressure 108/76, pulse 100, temperature 97.7 F (36.5 C), temperature source Oral, resp. rate 18, height 5' 6.5" (1.689 m), weight 205 lb (93  kg), SpO2 94 %.  General: Alert, interactive, in no acute distress. HEENT: TMs pearly gray, turbinates edematous with thick discharge, post-pharynx moderately erythematous. Neck: Supple without lymphadenopathy. Lungs: Decreased breath sounds bilaterally without wheezing, rhonchi or rales. CV: Normal S1, S2 without murmurs. Abdomen: Nondistended, nontender. Skin: Warm and dry, without lesions or rashes. Extremities:  No clubbing, cyanosis or edema. Neuro:   Grossly intact.  Review of systems:  Review of systems negative except as noted in HPI / PMHx or noted below: Review of Systems  Constitutional: Negative.   HENT: Negative.   Eyes: Negative.   Respiratory: Negative.   Cardiovascular: Negative.   Gastrointestinal: Negative.   Genitourinary: Negative.   Musculoskeletal: Negative.   Skin: Negative.   Neurological: Negative.   Endo/Heme/Allergies: Negative.   Psychiatric/Behavioral: Negative.     Past medical history:  Past Medical History:  Diagnosis Date  . Asthma   . DM (diabetes mellitus) (HCC)   . Hypertension   . Insomnia   . OSA (obstructive sleep apnea) 01/09/2013    Past surgical history:  Past Surgical History:  Procedure Laterality Date  . VASECTOMY      Family history: Family History  Problem Relation Age of Onset  . Diabetes Mother   . Hypertension Mother   . Asthma Mother   . Allergic rhinitis Other   . Asthma Other   . Angioedema Neg Hx   . Atopy Neg Hx   . Eczema Neg Hx   . Immunodeficiency Neg Hx   . Urticaria Neg Hx     Social history: Social History   Social History  . Marital status: Married    Spouse name: N/A  . Number of children: N/A  . Years of education: N/A   Occupational History  . Not on file.   Social History Main Topics  . Smoking status: Never Smoker  . Smokeless tobacco: Never Used  . Alcohol use No  . Drug use: No  . Sexual activity: Yes   Other Topics Concern  . Not on file   Social History Narrative  .  No narrative on file   Environmental History: The patient lives in a 48 year old house with carpeting in the bedroom, gassy, and central air.  There is a dog in house which does not have access to his bedroom.  There is no known issue with water damage or mildew in the home.  He is a nonsmoker.  Allergies as of 09/07/2016      Reactions   Azithromycin Itching, Rash   Rash/ itching   Nsaids Other (See Comments)   Other reaction(s): ASTHMA   Aspirin Other (See Comments)   Other reaction(s): ASTHMA Triggers asthma   Excedrin Extra Strength  [aspirin-acetaminophen-caffeine]    Ibuprofen    Naproxen Sodium       Medication List       Accurate as of 09/07/16  6:27 PM. Always use your most recent med list.          albuterol (2.5 MG/3ML) 0.083% nebulizer solution Commonly known as:  PROVENTIL Take 2.5 mg by nebulization every 6 (  six) hours as needed for wheezing.   albuterol 108 (90 Base) MCG/ACT inhaler Commonly known as:  PROVENTIL HFA;VENTOLIN HFA Inhale 2 puffs into the lungs every 6 (six) hours as needed for wheezing.   atorvastatin 10 MG tablet Commonly known as:  LIPITOR Take 10 mg by mouth.   AUVI-Q 0.3 mg/0.3 mL Soaj injection Generic drug:  EPINEPHrine   Azelastine HCl 0.15 % Soln Place 2 sprays into both nostrils 2 (two) times daily.   Azelastine-Fluticasone 137-50 MCG/ACT Susp Place 1 puff into the nose daily.   beclomethasone 80 MCG/ACT inhaler Commonly known as:  QVAR Inhale 1 puff into the lungs as needed.   benzonatate 100 MG capsule Commonly known as:  TESSALON Take 100 mg by mouth 3 (three) times daily as needed for cough.   cetirizine 10 MG tablet Commonly known as:  ZYRTEC Take 10 mg by mouth daily.   diphenhydrAMINE 25 MG tablet Commonly known as:  BENADRYL Take 25 mg by mouth every 8 (eight) hours as needed for itching.   fluticasone 50 MCG/ACT nasal spray Commonly known as:  FLONASE 1 spray by Each Nare route Two (2) times a day.     fluticasone-salmeterol 230-21 MCG/ACT inhaler Commonly known as:  ADVAIR HFA Inhale 2 puffs into the lungs 2 (two) times daily.   FREESTYLE LITE Devi   FREESTYLE LITE test strip Generic drug:  glucose blood   methocarbamol 500 MG tablet Commonly known as:  ROBAXIN Take 500 mg by mouth.   mometasone-formoterol 200-5 MCG/ACT Aero Commonly known as:  DULERA Inhale 2 puffs into the lungs 2 (two) times daily.   montelukast 10 MG tablet Commonly known as:  SINGULAIR Take 10 mg by mouth at bedtime.   nystatin 100000 UNIT/ML suspension Commonly known as:  MYCOSTATIN   omeprazole 20 MG capsule Commonly known as:  PRILOSEC Take 20 mg by mouth.   PHARMACIST CHOICE LANCETS Misc   SPIRIVA RESPIMAT 1.25 MCG/ACT Aers Generic drug:  Tiotropium Bromide Monohydrate Inhale into the lungs.   tolterodine 4 MG 24 hr capsule Commonly known as:  DETROL LA Take 4 mg by mouth daily.   traMADol 50 MG tablet Commonly known as:  ULTRAM Take 50 mg by mouth.   zolpidem 12.5 MG CR tablet Commonly known as:  AMBIEN CR Take 12.5 mg by mouth.       Known medication allergies: Allergies  Allergen Reactions  . Azithromycin Itching and Rash    Rash/ itching  . Nsaids Other (See Comments)    Other reaction(s): ASTHMA  . Aspirin Other (See Comments)    Other reaction(s): ASTHMA Triggers asthma  . Excedrin Extra Strength  [Aspirin-Acetaminophen-Caffeine]   . Ibuprofen   . Naproxen Sodium     I appreciate the opportunity to take part in Arath's care. Please do not hesitate to contact me with questions.  Sincerely,   R. Jorene Guest, MD

## 2016-09-07 NOTE — Assessment & Plan Note (Signed)
We were unable to perform skin tests today due to recent administration of antihistamine.   A prescription has been provided for azelastine nasal spray, 1-2 sprays per nostril 2 times daily as needed. Proper nasal spray technique has been discussed and demonstrated.   I have also recommended nasal saline spray (i.e., Simply Saline) or nasal saline lavage (i.e., NeilMed) as needed prior to medicated nasal sprays.  Continue montelukast (as above).  The patient is scheduled to return in the next 2 weeks for allergy skin testing after having been off of antihistamines for at least 3 days. Based on skin test results, we will proceed with mixing immunotherapy vials.

## 2016-09-14 ENCOUNTER — Ambulatory Visit: Payer: PRIVATE HEALTH INSURANCE | Admitting: Allergy and Immunology

## 2016-09-14 LAB — CBC WITH DIFFERENTIAL/PLATELET
Basophils Absolute: 0 10*3/uL (ref 0.0–0.2)
Basos: 0 %
EOS (ABSOLUTE): 0.2 10*3/uL (ref 0.0–0.4)
Eos: 2 %
Hematocrit: 41.6 % (ref 37.5–51.0)
Hemoglobin: 14.3 g/dL (ref 13.0–17.7)
Immature Grans (Abs): 0 10*3/uL (ref 0.0–0.1)
Immature Granulocytes: 0 %
Lymphocytes Absolute: 1.4 10*3/uL (ref 0.7–3.1)
Lymphs: 17 %
MCH: 29.6 pg (ref 26.6–33.0)
MCHC: 34.4 g/dL (ref 31.5–35.7)
MCV: 86 fL (ref 79–97)
Monocytes Absolute: 0.8 10*3/uL (ref 0.1–0.9)
Monocytes: 9 %
Neutrophils Absolute: 6.1 10*3/uL (ref 1.4–7.0)
Neutrophils: 72 %
Platelets: 224 10*3/uL (ref 150–379)
RBC: 4.83 x10E6/uL (ref 4.14–5.80)
RDW: 13 % (ref 12.3–15.4)
WBC: 8.5 10*3/uL (ref 3.4–10.8)

## 2016-09-14 LAB — IGE: IgE (Immunoglobulin E), Serum: 348 IU/mL — ABNORMAL HIGH (ref 0–100)

## 2016-09-28 ENCOUNTER — Encounter: Payer: Self-pay | Admitting: Allergy and Immunology

## 2016-09-28 ENCOUNTER — Ambulatory Visit (INDEPENDENT_AMBULATORY_CARE_PROVIDER_SITE_OTHER): Payer: BLUE CROSS/BLUE SHIELD | Admitting: Allergy and Immunology

## 2016-09-28 VITALS — BP 138/90 | HR 98 | Temp 97.7°F | Resp 20

## 2016-09-28 DIAGNOSIS — J4551 Severe persistent asthma with (acute) exacerbation: Secondary | ICD-10-CM

## 2016-09-28 DIAGNOSIS — J309 Allergic rhinitis, unspecified: Secondary | ICD-10-CM | POA: Diagnosis not present

## 2016-09-28 MED ORDER — EPINEPHRINE 0.3 MG/0.3ML IJ SOAJ: 0.3000 mg | Freq: Once | INTRAMUSCULAR | 1 refills | Status: AC

## 2016-09-28 NOTE — Assessment & Plan Note (Addendum)
Suboptimally controlled despite compliance with multiple medications.   Based upon his history and lab work results, he is an excellent candidate to restart omalizumab therapy.  The paperwork will be submitted and he will restart in the near future.   For now, continue Advair 230/21 g, 2 inhalations twice a day, Spiriva Respimat, montelukast 10 mg daily, and albuterol HFA every 4-6 hours as needed.  Subjective and objective measures of pulmonary function will be followed and the treatment plan will be adjusted accordingly.

## 2016-09-28 NOTE — Patient Instructions (Signed)
Severe persistent asthma Suboptimally controlled despite compliance with multiple medications.   Based upon his history and lab work results, he is an excellent candidate to restart omalizumab therapy.  The paperwork will be submitted and he will restart in the near future.   For now, continue Advair 230/21 g, 2 inhalations twice a day, Spiriva Respimat, montelukast 10 mg daily, and albuterol HFA every 4-6 hours as needed.  Subjective and objective measures of pulmonary function will be followed and the treatment plan will be adjusted accordingly.  Allergic rhinitis  Aeroallergen avoidance measures have been discussed and provided in written form.  Continue montelukast 10 mg daily bedtime and azelastine nasal spray, 2 sprays per nostril twice daily as needed.  I have also recommended nasal saline spray (i.e., Simply Saline) or nasal saline lavage (i.e., NeilMed) as needed prior to medicated nasal sprays.  The risks and benefits of aeroallergen immunotherapy have been discussed. The patient is motivated to restart immunotherapy to reduce symptoms and decrease medication requirement. Informed consent has been signed and allergen vaccine orders have been submitted. Medications will be decreased or discontinued as symptom relief from immunotherapy becomes evident.  We will bridge the build-up injections of immunotherapy with Xolair.   Return in about 2 months (around 11/26/2016), or if symptoms worsen or fail to improve.

## 2016-09-28 NOTE — Assessment & Plan Note (Addendum)
   Aeroallergen avoidance measures have been discussed and provided in written form.  Continue montelukast 10 mg daily bedtime and azelastine nasal spray, 2 sprays per nostril twice daily as needed.  I have also recommended nasal saline spray (i.e., Simply Saline) or nasal saline lavage (i.e., NeilMed) as needed prior to medicated nasal sprays.  The risks and benefits of aeroallergen immunotherapy have been discussed. The patient is motivated to restart immunotherapy to reduce symptoms and decrease medication requirement. Informed consent has been signed and allergen vaccine orders have been submitted. Medications will be decreased or discontinued as symptom relief from immunotherapy becomes evident.  We will bridge the build-up injections of immunotherapy with Xolair.

## 2016-09-28 NOTE — Progress Notes (Signed)
Follow-up Note  RE: Clifford Williamson MRN: [id number] DOB: [birth year] Date of Office Visit: 09/28/2016  Primary care provider: Pcp Not In System Referring provider: No ref. provider found  History of present illness: Clifford Williamson is a 46 y.o. male with persistent asthma and allergic rhinitis presenting today for allergy skin testing.  He discontinued antihistamines over the past 3 days in anticipation of today's testing and has been experiencing increased nasal congestion, rhinorrhea, and postnasal drainage.  In addition, he required albuterol via the nebulizer for dyspnea and wheezing with mild exertion.  He has not been experiencing fevers, chills, or discolored mucus production.   Assessment and plan: Severe persistent asthma Suboptimally controlled despite compliance with multiple medications.   Based upon his history and lab work results, he is an excellent candidate to restart omalizumab therapy.  The paperwork will be submitted and he will restart in the near future.   For now, continue Advair 230/21 g, 2 inhalations twice a day, Spiriva Respimat, montelukast 10 mg daily, and albuterol HFA every 4-6 hours as needed.  Subjective and objective measures of pulmonary function will be followed and the treatment plan will be adjusted accordingly.  Allergic rhinitis  Aeroallergen avoidance measures have been discussed and provided in written form.  Continue montelukast 10 mg daily bedtime and azelastine nasal spray, 2 sprays per nostril twice daily as needed.  I have also recommended nasal saline spray (i.e., Simply Saline) or nasal saline lavage (i.e., NeilMed) as needed prior to medicated nasal sprays.  The risks and benefits of aeroallergen immunotherapy have been discussed. The patient is motivated to restart immunotherapy to reduce symptoms and decrease medication requirement. Informed consent has been signed and allergen vaccine orders have been submitted. Medications will be  decreased or discontinued as symptom relief from immunotherapy becomes evident.  We will bridge the build-up injections of immunotherapy with Xolair.   Meds ordered this encounter  Medications  . EPINEPHrine 0.3 mg/0.3 mL IJ SOAJ injection    Sig: Inject 0.3 mLs (0.3 mg total) into the muscle once.    Dispense:  0.3 mL    Refill:  1    Diagnostics: Spirometry reveals an FVC of 2.06 L (56% predicted) and FEV1 of 1.36 L (46% predicted) with significant (560 mL, 41%) post bronchodilator improvement.  Please see scanned spirometry results for details. Allergy skin testing: Positive to grass pollens, weed pollens, ragweed pollen, tree pollens, molds, cat hair, dog epithelia, cockroach antigen, and dust mite antigen.    Physical examination: Blood pressure 138/90, pulse 98, temperature 97.7 F (36.5 C), temperature source Oral, resp. rate 20, SpO2 95 %.  General: Alert, interactive, in no acute distress. HEENT: TMs pearly gray, turbinates edematous and pale with thick discharge, post-pharynx moderately erythematous. Neck: Supple without lymphadenopathy. Lungs: Mildly decreased breath sounds bilaterally without wheezing, rhonchi or rales. CV: Normal S1, S2 without murmurs. Skin: Warm and dry, without lesions or rashes.  The following portions of the patient's history were reviewed and updated as appropriate: allergies, current medications, past family history, past medical history, past social history, past surgical history and problem list.  Allergies as of 09/28/2016      Reactions   Azithromycin Itching, Rash   Rash/ itching   Nsaids Other (See Comments)   Other reaction(s): ASTHMA   Aspirin Other (See Comments)   Other reaction(s): ASTHMA Triggers asthma   Excedrin Extra Strength  [aspirin-acetaminophen-caffeine]    Ibuprofen    Naproxen Sodium       Medication  List       Accurate as of 09/28/16  1:29 PM. Always use your most recent med list.          albuterol (2.5  MG/3ML) 0.083% nebulizer solution Commonly known as:  PROVENTIL Take 2.5 mg by nebulization every 6 (six) hours as needed for wheezing.   albuterol 108 (90 Base) MCG/ACT inhaler Commonly known as:  PROVENTIL HFA;VENTOLIN HFA Inhale 2 puffs into the lungs every 6 (six) hours as needed for wheezing.   atorvastatin 10 MG tablet Commonly known as:  LIPITOR Take 10 mg by mouth.   AUVI-Q 0.3 mg/0.3 mL Soaj injection Generic drug:  EPINEPHrine   EPINEPHrine 0.3 mg/0.3 mL Soaj injection Commonly known as:  EPI-PEN Inject 0.3 mLs (0.3 mg total) into the muscle once.   Azelastine HCl 0.15 % Soln Place 2 sprays into both nostrils 2 (two) times daily.   Azelastine-Fluticasone 137-50 MCG/ACT Susp Place 1 puff into the nose daily.   beclomethasone 80 MCG/ACT inhaler Commonly known as:  QVAR Inhale 1 puff into the lungs as needed.   benzonatate 100 MG capsule Commonly known as:  TESSALON Take 100 mg by mouth 3 (three) times daily as needed for cough.   cetirizine 10 MG tablet Commonly known as:  ZYRTEC Take 10 mg by mouth daily.   diphenhydrAMINE 25 MG tablet Commonly known as:  BENADRYL Take 25 mg by mouth every 8 (eight) hours as needed for itching.   fluticasone 50 MCG/ACT nasal spray Commonly known as:  FLONASE 1 spray by Each Nare route Two (2) times a day.   fluticasone-salmeterol 230-21 MCG/ACT inhaler Commonly known as:  ADVAIR HFA Inhale 2 puffs into the lungs 2 (two) times daily.   FREESTYLE LITE Devi   FREESTYLE LITE test strip Generic drug:  glucose blood   methocarbamol 500 MG tablet Commonly known as:  ROBAXIN Take 500 mg by mouth.   mometasone-formoterol 200-5 MCG/ACT Aero Commonly known as:  DULERA Inhale 2 puffs into the lungs 2 (two) times daily.   montelukast 10 MG tablet Commonly known as:  SINGULAIR Take 10 mg by mouth at bedtime.   nystatin 100000 UNIT/ML suspension Commonly known as:  MYCOSTATIN   omeprazole 20 MG capsule Commonly known  as:  PRILOSEC Take 20 mg by mouth.   PHARMACIST CHOICE LANCETS Misc   SPIRIVA RESPIMAT 1.25 MCG/ACT Aers Generic drug:  Tiotropium Bromide Monohydrate Inhale into the lungs.   tolterodine 4 MG 24 hr capsule Commonly known as:  DETROL LA Take 4 mg by mouth daily.   traMADol 50 MG tablet Commonly known as:  ULTRAM Take 50 mg by mouth.   XOLAIR 150 MG injection Generic drug:  omalizumab   zolpidem 12.5 MG CR tablet Commonly known as:  AMBIEN CR Take 12.5 mg by mouth.       Allergies  Allergen Reactions  . Azithromycin Itching and Rash    Rash/ itching  . Nsaids Other (See Comments)    Other reaction(s): ASTHMA  . Aspirin Other (See Comments)    Other reaction(s): ASTHMA Triggers asthma  . Excedrin Extra Strength  [Aspirin-Acetaminophen-Caffeine]   . Ibuprofen   . Naproxen Sodium    Review of systems: Review of systems negative except as noted in HPI / PMHx or noted below: Constitutional: Negative.  HENT: Negative.   Eyes: Negative.  Respiratory: Negative.   Cardiovascular: Negative.  Gastrointestinal: Negative.  Genitourinary: Negative.  Musculoskeletal: Negative.  Neurological: Negative.  Endo/Heme/Allergies: Negative.  Cutaneous: Negative.  Past Medical  History:  Diagnosis Date  . Asthma   . DM (diabetes mellitus) (HCC)   . Hypertension   . Insomnia   . OSA (obstructive sleep apnea) 01/09/2013    Family History  Problem Relation Age of Onset  . Diabetes Mother   . Hypertension Mother   . Asthma Mother   . Allergic rhinitis Other   . Asthma Other   . Angioedema Neg Hx   . Atopy Neg Hx   . Eczema Neg Hx   . Immunodeficiency Neg Hx   . Urticaria Neg Hx     Social History   Social History  . Marital status: Married    Spouse name: N/A  . Number of children: N/A  . Years of education: N/A   Occupational History  . Not on file.   Social History Main Topics  . Smoking status: Never Smoker  . Smokeless tobacco: Never Used  . Alcohol use  No  . Drug use: No  . Sexual activity: Yes   Other Topics Concern  . Not on file   Social History Narrative  . No narrative on file    I appreciate the opportunity to take part in Nader's care. Please do not hesitate to contact me with questions.  Sincerely,   R. Jorene Guest, MD

## 2016-10-05 ENCOUNTER — Ambulatory Visit: Payer: BLUE CROSS/BLUE SHIELD

## 2016-10-12 ENCOUNTER — Ambulatory Visit: Payer: BLUE CROSS/BLUE SHIELD

## 2016-10-12 ENCOUNTER — Telehealth: Payer: Self-pay | Admitting: Pediatrics

## 2016-10-12 DIAGNOSIS — J4540 Moderate persistent asthma, uncomplicated: Secondary | ICD-10-CM | POA: Diagnosis not present

## 2016-10-12 MED ORDER — OMALIZUMAB 150 MG ~~LOC~~ SOLR
375.0000 mg | SUBCUTANEOUS | Status: AC
  Administered 2016-10-12 – 2024-02-15 (×120): 375 mg via SUBCUTANEOUS

## 2016-10-12 NOTE — Telephone Encounter (Signed)
Called pt - he said someone had already called & answered his questions

## 2016-10-12 NOTE — Telephone Encounter (Signed)
Please call patient back regarding his concern for his insurance set up for his allergy shots. Thanks

## 2016-10-19 ENCOUNTER — Ambulatory Visit: Payer: BLUE CROSS/BLUE SHIELD

## 2016-10-19 DIAGNOSIS — J301 Allergic rhinitis due to pollen: Secondary | ICD-10-CM | POA: Diagnosis not present

## 2016-10-20 DIAGNOSIS — J3089 Other allergic rhinitis: Secondary | ICD-10-CM | POA: Diagnosis not present

## 2016-10-26 ENCOUNTER — Ambulatory Visit (INDEPENDENT_AMBULATORY_CARE_PROVIDER_SITE_OTHER): Payer: BLUE CROSS/BLUE SHIELD

## 2016-10-26 DIAGNOSIS — J4540 Moderate persistent asthma, uncomplicated: Secondary | ICD-10-CM

## 2016-10-28 ENCOUNTER — Ambulatory Visit (INDEPENDENT_AMBULATORY_CARE_PROVIDER_SITE_OTHER): Payer: BLUE CROSS/BLUE SHIELD

## 2016-10-28 DIAGNOSIS — J309 Allergic rhinitis, unspecified: Secondary | ICD-10-CM | POA: Diagnosis not present

## 2016-10-28 NOTE — Progress Notes (Signed)
Immunotherapy   Patient Details  Name: Clifford Williamson MRN: [id number] Date of Birth: [birth year]  10/28/2016  Clifford Williamson started injections for Blue 1:100,000 (Mold-Cat-Dog-CR and Grass-Weeds-Tree-DMite) Following schedule: A  Frequency:2 times per week Epi-Pen:Epi-Pen Available  Consent signed and patient instructions given. No problems after 30 minute wait   Damita Gainey 10/28/2016, 9:15 AM

## 2016-11-02 ENCOUNTER — Ambulatory Visit: Payer: BLUE CROSS/BLUE SHIELD

## 2016-11-09 ENCOUNTER — Ambulatory Visit (INDEPENDENT_AMBULATORY_CARE_PROVIDER_SITE_OTHER): Payer: BLUE CROSS/BLUE SHIELD | Admitting: *Deleted

## 2016-11-09 DIAGNOSIS — J4540 Moderate persistent asthma, uncomplicated: Secondary | ICD-10-CM | POA: Diagnosis not present

## 2016-11-14 ENCOUNTER — Ambulatory Visit (INDEPENDENT_AMBULATORY_CARE_PROVIDER_SITE_OTHER): Payer: BLUE CROSS/BLUE SHIELD

## 2016-11-14 DIAGNOSIS — J309 Allergic rhinitis, unspecified: Secondary | ICD-10-CM | POA: Diagnosis not present

## 2016-11-23 ENCOUNTER — Ambulatory Visit (INDEPENDENT_AMBULATORY_CARE_PROVIDER_SITE_OTHER): Payer: BLUE CROSS/BLUE SHIELD | Admitting: *Deleted

## 2016-11-23 DIAGNOSIS — J4540 Moderate persistent asthma, uncomplicated: Secondary | ICD-10-CM

## 2016-11-24 ENCOUNTER — Ambulatory Visit: Payer: BLUE CROSS/BLUE SHIELD | Admitting: Allergy and Immunology

## 2016-12-07 ENCOUNTER — Ambulatory Visit (INDEPENDENT_AMBULATORY_CARE_PROVIDER_SITE_OTHER): Payer: BLUE CROSS/BLUE SHIELD

## 2016-12-07 DIAGNOSIS — J4540 Moderate persistent asthma, uncomplicated: Secondary | ICD-10-CM

## 2016-12-09 ENCOUNTER — Ambulatory Visit (INDEPENDENT_AMBULATORY_CARE_PROVIDER_SITE_OTHER): Payer: BLUE CROSS/BLUE SHIELD

## 2016-12-09 DIAGNOSIS — J309 Allergic rhinitis, unspecified: Secondary | ICD-10-CM | POA: Diagnosis not present

## 2016-12-14 ENCOUNTER — Ambulatory Visit (INDEPENDENT_AMBULATORY_CARE_PROVIDER_SITE_OTHER): Payer: BLUE CROSS/BLUE SHIELD | Admitting: *Deleted

## 2016-12-14 DIAGNOSIS — J309 Allergic rhinitis, unspecified: Secondary | ICD-10-CM

## 2016-12-21 ENCOUNTER — Ambulatory Visit (INDEPENDENT_AMBULATORY_CARE_PROVIDER_SITE_OTHER): Payer: BLUE CROSS/BLUE SHIELD | Admitting: *Deleted

## 2016-12-21 DIAGNOSIS — J309 Allergic rhinitis, unspecified: Secondary | ICD-10-CM

## 2016-12-21 DIAGNOSIS — J4540 Moderate persistent asthma, uncomplicated: Secondary | ICD-10-CM

## 2016-12-27 ENCOUNTER — Ambulatory Visit (INDEPENDENT_AMBULATORY_CARE_PROVIDER_SITE_OTHER): Payer: BLUE CROSS/BLUE SHIELD

## 2016-12-27 DIAGNOSIS — J309 Allergic rhinitis, unspecified: Secondary | ICD-10-CM | POA: Diagnosis not present

## 2017-01-04 ENCOUNTER — Ambulatory Visit (INDEPENDENT_AMBULATORY_CARE_PROVIDER_SITE_OTHER): Payer: BLUE CROSS/BLUE SHIELD | Admitting: *Deleted

## 2017-01-04 DIAGNOSIS — J4540 Moderate persistent asthma, uncomplicated: Secondary | ICD-10-CM

## 2017-01-09 ENCOUNTER — Ambulatory Visit (INDEPENDENT_AMBULATORY_CARE_PROVIDER_SITE_OTHER): Payer: BLUE CROSS/BLUE SHIELD

## 2017-01-09 DIAGNOSIS — J309 Allergic rhinitis, unspecified: Secondary | ICD-10-CM

## 2017-01-18 ENCOUNTER — Ambulatory Visit (INDEPENDENT_AMBULATORY_CARE_PROVIDER_SITE_OTHER): Payer: BLUE CROSS/BLUE SHIELD

## 2017-01-18 DIAGNOSIS — J4540 Moderate persistent asthma, uncomplicated: Secondary | ICD-10-CM | POA: Diagnosis not present

## 2017-01-26 ENCOUNTER — Ambulatory Visit (INDEPENDENT_AMBULATORY_CARE_PROVIDER_SITE_OTHER): Payer: BLUE CROSS/BLUE SHIELD

## 2017-01-26 DIAGNOSIS — J309 Allergic rhinitis, unspecified: Secondary | ICD-10-CM

## 2017-02-01 ENCOUNTER — Ambulatory Visit (INDEPENDENT_AMBULATORY_CARE_PROVIDER_SITE_OTHER): Payer: BLUE CROSS/BLUE SHIELD

## 2017-02-01 DIAGNOSIS — J4540 Moderate persistent asthma, uncomplicated: Secondary | ICD-10-CM | POA: Diagnosis not present

## 2017-02-07 ENCOUNTER — Ambulatory Visit (INDEPENDENT_AMBULATORY_CARE_PROVIDER_SITE_OTHER): Payer: BLUE CROSS/BLUE SHIELD | Admitting: *Deleted

## 2017-02-07 DIAGNOSIS — J309 Allergic rhinitis, unspecified: Secondary | ICD-10-CM

## 2017-02-15 ENCOUNTER — Ambulatory Visit (INDEPENDENT_AMBULATORY_CARE_PROVIDER_SITE_OTHER): Payer: BLUE CROSS/BLUE SHIELD

## 2017-02-15 DIAGNOSIS — J4540 Moderate persistent asthma, uncomplicated: Secondary | ICD-10-CM | POA: Diagnosis not present

## 2017-02-17 ENCOUNTER — Ambulatory Visit (INDEPENDENT_AMBULATORY_CARE_PROVIDER_SITE_OTHER): Payer: BLUE CROSS/BLUE SHIELD

## 2017-02-17 DIAGNOSIS — J309 Allergic rhinitis, unspecified: Secondary | ICD-10-CM | POA: Diagnosis not present

## 2017-02-20 ENCOUNTER — Ambulatory Visit (INDEPENDENT_AMBULATORY_CARE_PROVIDER_SITE_OTHER): Payer: BLUE CROSS/BLUE SHIELD

## 2017-02-20 DIAGNOSIS — J309 Allergic rhinitis, unspecified: Secondary | ICD-10-CM | POA: Diagnosis not present

## 2017-02-28 ENCOUNTER — Ambulatory Visit (INDEPENDENT_AMBULATORY_CARE_PROVIDER_SITE_OTHER): Payer: BLUE CROSS/BLUE SHIELD | Admitting: *Deleted

## 2017-02-28 DIAGNOSIS — J4540 Moderate persistent asthma, uncomplicated: Secondary | ICD-10-CM | POA: Diagnosis not present

## 2017-03-01 ENCOUNTER — Ambulatory Visit: Payer: Self-pay

## 2017-03-03 ENCOUNTER — Ambulatory Visit (INDEPENDENT_AMBULATORY_CARE_PROVIDER_SITE_OTHER): Payer: BLUE CROSS/BLUE SHIELD

## 2017-03-03 DIAGNOSIS — J309 Allergic rhinitis, unspecified: Secondary | ICD-10-CM

## 2017-03-10 ENCOUNTER — Ambulatory Visit (INDEPENDENT_AMBULATORY_CARE_PROVIDER_SITE_OTHER): Payer: BLUE CROSS/BLUE SHIELD

## 2017-03-10 DIAGNOSIS — J309 Allergic rhinitis, unspecified: Secondary | ICD-10-CM

## 2017-03-14 ENCOUNTER — Ambulatory Visit: Payer: Self-pay

## 2017-03-16 ENCOUNTER — Ambulatory Visit (INDEPENDENT_AMBULATORY_CARE_PROVIDER_SITE_OTHER): Payer: BLUE CROSS/BLUE SHIELD

## 2017-03-16 ENCOUNTER — Ambulatory Visit: Payer: BLUE CROSS/BLUE SHIELD

## 2017-03-16 DIAGNOSIS — J4540 Moderate persistent asthma, uncomplicated: Secondary | ICD-10-CM

## 2017-03-21 ENCOUNTER — Ambulatory Visit (INDEPENDENT_AMBULATORY_CARE_PROVIDER_SITE_OTHER): Payer: BLUE CROSS/BLUE SHIELD

## 2017-03-21 DIAGNOSIS — J309 Allergic rhinitis, unspecified: Secondary | ICD-10-CM

## 2017-03-29 ENCOUNTER — Ambulatory Visit (INDEPENDENT_AMBULATORY_CARE_PROVIDER_SITE_OTHER): Payer: BLUE CROSS/BLUE SHIELD

## 2017-03-29 DIAGNOSIS — J4540 Moderate persistent asthma, uncomplicated: Secondary | ICD-10-CM

## 2017-04-06 ENCOUNTER — Ambulatory Visit (INDEPENDENT_AMBULATORY_CARE_PROVIDER_SITE_OTHER): Payer: BLUE CROSS/BLUE SHIELD

## 2017-04-06 DIAGNOSIS — J309 Allergic rhinitis, unspecified: Secondary | ICD-10-CM | POA: Diagnosis not present

## 2017-04-12 ENCOUNTER — Ambulatory Visit (INDEPENDENT_AMBULATORY_CARE_PROVIDER_SITE_OTHER): Payer: BLUE CROSS/BLUE SHIELD

## 2017-04-12 DIAGNOSIS — J4540 Moderate persistent asthma, uncomplicated: Secondary | ICD-10-CM

## 2017-04-17 ENCOUNTER — Ambulatory Visit (INDEPENDENT_AMBULATORY_CARE_PROVIDER_SITE_OTHER): Payer: BLUE CROSS/BLUE SHIELD

## 2017-04-17 DIAGNOSIS — J309 Allergic rhinitis, unspecified: Secondary | ICD-10-CM

## 2017-04-26 ENCOUNTER — Ambulatory Visit: Payer: Self-pay

## 2017-05-03 ENCOUNTER — Ambulatory Visit (INDEPENDENT_AMBULATORY_CARE_PROVIDER_SITE_OTHER): Payer: BLUE CROSS/BLUE SHIELD

## 2017-05-03 DIAGNOSIS — J4540 Moderate persistent asthma, uncomplicated: Secondary | ICD-10-CM | POA: Diagnosis not present

## 2017-05-08 ENCOUNTER — Ambulatory Visit (INDEPENDENT_AMBULATORY_CARE_PROVIDER_SITE_OTHER): Payer: BLUE CROSS/BLUE SHIELD

## 2017-05-08 DIAGNOSIS — J309 Allergic rhinitis, unspecified: Secondary | ICD-10-CM | POA: Diagnosis not present

## 2017-05-17 ENCOUNTER — Ambulatory Visit (INDEPENDENT_AMBULATORY_CARE_PROVIDER_SITE_OTHER): Payer: BLUE CROSS/BLUE SHIELD

## 2017-05-17 ENCOUNTER — Ambulatory Visit: Payer: Self-pay

## 2017-05-17 DIAGNOSIS — J4540 Moderate persistent asthma, uncomplicated: Secondary | ICD-10-CM | POA: Diagnosis not present

## 2017-05-17 NOTE — Progress Notes (Signed)
This encounter was created in error - please disregard.

## 2017-05-17 NOTE — Addendum Note (Signed)
Addended byClyda Greener M on: 05/17/2017 04:13 PM   Modules accepted: Level of Service, SmartSet

## 2017-05-22 ENCOUNTER — Ambulatory Visit (INDEPENDENT_AMBULATORY_CARE_PROVIDER_SITE_OTHER): Payer: BLUE CROSS/BLUE SHIELD

## 2017-05-22 DIAGNOSIS — J309 Allergic rhinitis, unspecified: Secondary | ICD-10-CM | POA: Diagnosis not present

## 2017-05-30 ENCOUNTER — Ambulatory Visit: Payer: Self-pay

## 2017-05-31 ENCOUNTER — Ambulatory Visit (INDEPENDENT_AMBULATORY_CARE_PROVIDER_SITE_OTHER): Payer: BLUE CROSS/BLUE SHIELD | Admitting: *Deleted

## 2017-05-31 DIAGNOSIS — J4540 Moderate persistent asthma, uncomplicated: Secondary | ICD-10-CM | POA: Diagnosis not present

## 2017-06-05 ENCOUNTER — Ambulatory Visit (INDEPENDENT_AMBULATORY_CARE_PROVIDER_SITE_OTHER): Payer: BLUE CROSS/BLUE SHIELD | Admitting: *Deleted

## 2017-06-05 DIAGNOSIS — J309 Allergic rhinitis, unspecified: Secondary | ICD-10-CM

## 2017-06-13 ENCOUNTER — Ambulatory Visit (INDEPENDENT_AMBULATORY_CARE_PROVIDER_SITE_OTHER): Payer: BLUE CROSS/BLUE SHIELD | Admitting: *Deleted

## 2017-06-13 DIAGNOSIS — J4540 Moderate persistent asthma, uncomplicated: Secondary | ICD-10-CM

## 2017-06-14 ENCOUNTER — Ambulatory Visit: Payer: Self-pay

## 2017-06-16 ENCOUNTER — Ambulatory Visit (INDEPENDENT_AMBULATORY_CARE_PROVIDER_SITE_OTHER): Payer: BLUE CROSS/BLUE SHIELD

## 2017-06-16 DIAGNOSIS — J309 Allergic rhinitis, unspecified: Secondary | ICD-10-CM | POA: Diagnosis not present

## 2017-06-27 ENCOUNTER — Ambulatory Visit: Payer: Self-pay

## 2017-06-27 ENCOUNTER — Ambulatory Visit (INDEPENDENT_AMBULATORY_CARE_PROVIDER_SITE_OTHER): Payer: BLUE CROSS/BLUE SHIELD | Admitting: *Deleted

## 2017-06-27 DIAGNOSIS — J309 Allergic rhinitis, unspecified: Secondary | ICD-10-CM | POA: Diagnosis not present

## 2017-06-28 ENCOUNTER — Ambulatory Visit (INDEPENDENT_AMBULATORY_CARE_PROVIDER_SITE_OTHER): Payer: BLUE CROSS/BLUE SHIELD

## 2017-06-28 DIAGNOSIS — J4540 Moderate persistent asthma, uncomplicated: Secondary | ICD-10-CM | POA: Diagnosis not present

## 2017-07-12 ENCOUNTER — Ambulatory Visit: Payer: Self-pay

## 2017-07-12 ENCOUNTER — Ambulatory Visit (INDEPENDENT_AMBULATORY_CARE_PROVIDER_SITE_OTHER): Payer: BLUE CROSS/BLUE SHIELD

## 2017-07-12 DIAGNOSIS — J4540 Moderate persistent asthma, uncomplicated: Secondary | ICD-10-CM | POA: Diagnosis not present

## 2017-07-14 ENCOUNTER — Ambulatory Visit (INDEPENDENT_AMBULATORY_CARE_PROVIDER_SITE_OTHER): Payer: BLUE CROSS/BLUE SHIELD

## 2017-07-14 DIAGNOSIS — J4540 Moderate persistent asthma, uncomplicated: Secondary | ICD-10-CM | POA: Diagnosis not present

## 2017-07-26 ENCOUNTER — Ambulatory Visit (INDEPENDENT_AMBULATORY_CARE_PROVIDER_SITE_OTHER): Payer: BLUE CROSS/BLUE SHIELD

## 2017-07-26 DIAGNOSIS — J4540 Moderate persistent asthma, uncomplicated: Secondary | ICD-10-CM

## 2017-07-28 ENCOUNTER — Ambulatory Visit (INDEPENDENT_AMBULATORY_CARE_PROVIDER_SITE_OTHER): Payer: BLUE CROSS/BLUE SHIELD

## 2017-07-28 DIAGNOSIS — J309 Allergic rhinitis, unspecified: Secondary | ICD-10-CM | POA: Diagnosis not present

## 2017-08-07 ENCOUNTER — Ambulatory Visit (INDEPENDENT_AMBULATORY_CARE_PROVIDER_SITE_OTHER): Payer: BLUE CROSS/BLUE SHIELD

## 2017-08-07 DIAGNOSIS — J309 Allergic rhinitis, unspecified: Secondary | ICD-10-CM | POA: Diagnosis not present

## 2017-08-09 ENCOUNTER — Ambulatory Visit: Payer: Self-pay

## 2017-08-09 ENCOUNTER — Ambulatory Visit (INDEPENDENT_AMBULATORY_CARE_PROVIDER_SITE_OTHER): Payer: BLUE CROSS/BLUE SHIELD | Admitting: *Deleted

## 2017-08-09 DIAGNOSIS — J4540 Moderate persistent asthma, uncomplicated: Secondary | ICD-10-CM | POA: Diagnosis not present

## 2017-08-10 ENCOUNTER — Ambulatory Visit: Payer: Self-pay

## 2017-08-16 ENCOUNTER — Ambulatory Visit (INDEPENDENT_AMBULATORY_CARE_PROVIDER_SITE_OTHER): Payer: BLUE CROSS/BLUE SHIELD | Admitting: *Deleted

## 2017-08-16 DIAGNOSIS — J309 Allergic rhinitis, unspecified: Secondary | ICD-10-CM | POA: Diagnosis not present

## 2017-08-23 ENCOUNTER — Ambulatory Visit: Payer: Self-pay

## 2017-08-23 ENCOUNTER — Telehealth: Payer: Self-pay

## 2017-08-23 NOTE — Telephone Encounter (Signed)
Patient needs OV before next Xolair.  Appt made with Dr. Nunzio CobbsBobbitt on 08/30/17 at 3:30 pm.  Pt Xolair should ship by this date.  Patient confirmed appt.

## 2017-08-25 ENCOUNTER — Ambulatory Visit (INDEPENDENT_AMBULATORY_CARE_PROVIDER_SITE_OTHER): Payer: BLUE CROSS/BLUE SHIELD

## 2017-08-25 DIAGNOSIS — J309 Allergic rhinitis, unspecified: Secondary | ICD-10-CM

## 2017-08-30 ENCOUNTER — Ambulatory Visit: Payer: BLUE CROSS/BLUE SHIELD | Admitting: Allergy and Immunology

## 2017-08-31 ENCOUNTER — Ambulatory Visit: Payer: BLUE CROSS/BLUE SHIELD | Admitting: Allergy and Immunology

## 2017-08-31 DIAGNOSIS — J309 Allergic rhinitis, unspecified: Secondary | ICD-10-CM

## 2017-09-12 ENCOUNTER — Ambulatory Visit: Payer: BLUE CROSS/BLUE SHIELD | Admitting: Family Medicine

## 2017-09-13 ENCOUNTER — Ambulatory Visit (INDEPENDENT_AMBULATORY_CARE_PROVIDER_SITE_OTHER): Payer: BLUE CROSS/BLUE SHIELD

## 2017-09-13 DIAGNOSIS — J309 Allergic rhinitis, unspecified: Secondary | ICD-10-CM | POA: Diagnosis not present

## 2017-09-20 ENCOUNTER — Ambulatory Visit (INDEPENDENT_AMBULATORY_CARE_PROVIDER_SITE_OTHER): Payer: BLUE CROSS/BLUE SHIELD | Admitting: *Deleted

## 2017-09-20 ENCOUNTER — Other Ambulatory Visit: Payer: Self-pay | Admitting: *Deleted

## 2017-09-20 DIAGNOSIS — J309 Allergic rhinitis, unspecified: Secondary | ICD-10-CM | POA: Diagnosis not present

## 2017-09-20 MED ORDER — XOLAIR 150 MG ~~LOC~~ SOLR: 375.0000 mg | SUBCUTANEOUS | 11 refills | Status: DC

## 2017-09-27 ENCOUNTER — Ambulatory Visit: Payer: Self-pay

## 2017-09-28 ENCOUNTER — Ambulatory Visit (INDEPENDENT_AMBULATORY_CARE_PROVIDER_SITE_OTHER): Payer: BLUE CROSS/BLUE SHIELD | Admitting: *Deleted

## 2017-09-28 DIAGNOSIS — J4540 Moderate persistent asthma, uncomplicated: Secondary | ICD-10-CM | POA: Diagnosis not present

## 2017-10-02 ENCOUNTER — Ambulatory Visit: Payer: BLUE CROSS/BLUE SHIELD | Admitting: Allergy and Immunology

## 2017-10-03 ENCOUNTER — Ambulatory Visit: Payer: BLUE CROSS/BLUE SHIELD | Admitting: Allergy and Immunology

## 2017-10-04 ENCOUNTER — Ambulatory Visit (INDEPENDENT_AMBULATORY_CARE_PROVIDER_SITE_OTHER): Payer: BLUE CROSS/BLUE SHIELD

## 2017-10-04 DIAGNOSIS — J309 Allergic rhinitis, unspecified: Secondary | ICD-10-CM

## 2017-10-10 ENCOUNTER — Encounter: Payer: Self-pay | Admitting: Family Medicine

## 2017-10-10 ENCOUNTER — Ambulatory Visit: Payer: BLUE CROSS/BLUE SHIELD | Admitting: Family Medicine

## 2017-10-10 ENCOUNTER — Ambulatory Visit (INDEPENDENT_AMBULATORY_CARE_PROVIDER_SITE_OTHER): Payer: BLUE CROSS/BLUE SHIELD

## 2017-10-10 VITALS — BP 148/84 | HR 90 | Temp 97.8°F | Resp 20 | Ht 66.2 in | Wt 217.4 lb

## 2017-10-10 DIAGNOSIS — J4540 Moderate persistent asthma, uncomplicated: Secondary | ICD-10-CM

## 2017-10-10 DIAGNOSIS — J302 Other seasonal allergic rhinitis: Secondary | ICD-10-CM | POA: Insufficient documentation

## 2017-10-10 DIAGNOSIS — J3089 Other allergic rhinitis: Secondary | ICD-10-CM

## 2017-10-10 DIAGNOSIS — J4550 Severe persistent asthma, uncomplicated: Secondary | ICD-10-CM

## 2017-10-10 MED ORDER — MOMETASONE FURO-FORMOTEROL FUM 200-5 MCG/ACT IN AERO: 2.0000 | INHALATION_SPRAY | Freq: Two times a day (BID) | RESPIRATORY_TRACT | 5 refills | Status: DC

## 2017-10-10 MED ORDER — ALBUTEROL SULFATE HFA 108 (90 BASE) MCG/ACT IN AERS: 2.0000 | INHALATION_SPRAY | RESPIRATORY_TRACT | 2 refills | Status: DC | PRN

## 2017-10-10 MED ORDER — MONTELUKAST SODIUM 10 MG PO TABS: ORAL_TABLET | ORAL | 5 refills | Status: DC

## 2017-10-10 MED ORDER — FLUTICASONE PROPIONATE 50 MCG/ACT NA SUSP: 2.0000 | Freq: Every day | NASAL | 5 refills | Status: DC

## 2017-10-10 NOTE — Patient Instructions (Signed)
Begin Dulera 200- 2 puffs twice a day to prevent cough, wheeze, and shortness of breath ProAir 2 puffs every 4 hours as needed for cough, wheeze, and shortness of breath Continue montelukast 10 mg once a day to prevent cough, wheeze, and shortness of breath Continue Xolair injections once every 2 weeks Continue allergen immunotherapy once a week Begin Flonase nasal spray 2 sprays once a day for the next 3 months to control stuffy nose Begin cetirizine 10 mg once a day for the next 3 months to control runny nose  Begin nasal saline rinses daily before using any medicated nasal sprays  Continue the other medications as listed in your chart  Call me if this treatment plan is not working for you  Follow up in 3 months or sooner as needed

## 2017-10-10 NOTE — Progress Notes (Addendum)
[address] [place] [address] Dept: [phone]  FOLLOW UP NOTE  Patient ID: Clifford Williamson, male    DOB: [birth year]  Age: 47 y.o. MRN: [id number] Date of Office Visit: 10/10/2017  Assessment  Chief Complaint: Asthma (got Xolair inj 375 mg every 2 weeks) and Wheezing  HPI Clifford Williamson is a 47 year old male who presents to the clinic for a follow up visit. He was last seen in the clinic on 09/28/2016  by Dr. Nunzio Cobbs for evaluation of persistent asthma, and allergic rhinitis.  At that visit, he was noted to be positive on skin testing to grass pollens, weed pollens, ragweed pollen, tree pollens, molds, cat hair, dog epithelia, cockroach antigen, and dust mite antigen.  He was noted to have suboptimal control of asthma and paperwork was submitted to restart omalizumab therapy.  He was continued on montelukast 10 mg once daily, Advair 230-2 inhalations twice a day, Spiriva Respimat 1 inhalation once a day, and nasal saline rinses prior to medicated nasal sprays.    At today's visit, he reports his asthma has been well controlled.  He reports shortness of breath only when bending over.  He denies wheezing, cough, nighttime awakenings due to asthma, and shortness of breath with activity and rest.  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  He currently receives a Xolair injection every 2 weeks and an allergy injections once a week.  He currently uses Dulera 200, Spiriva Respimat 1.25, and ProAir all on an as-needed basis.  He reports he has not used any of these medications in about 1 year.  Allergic rhinitis is reported as moderately well controlled.  He is reporting nasal congestion with clear nasal drainage currently, however, he denies fever, headache, and sinus pressure.  He is using Flonase, cetirizine 10 mg, and nasal saline sprays on an as needed basis, which he reports as rarely.  His  current medications are listed in the chart.  Drug Allergies:  Allergies  Allergen Reactions  . Azithromycin Itching and Rash    Rash/ itching  . Nsaids Other (See Comments)    Other reaction(s): ASTHMA Other reaction(s): ASTHMA  . Aspirin Other (See Comments)    Other reaction(s): ASTHMA Triggers asthma  . Excedrin Extra Strength  [Aspirin-Acetaminophen-Caffeine]   . Ibuprofen   . Naproxen Sodium   . Other Other (See Comments)    Physical Exam: BP (!) 148/84 (BP Location: Right Arm, Patient Position: Sitting, Cuff Size: Large)   Pulse 90   Temp 97.8 F (36.6 C) (Oral)   Resp 20   Ht 5' 6.2" (1.681 m)   Wt 217 lb 6.4 oz (98.6 kg)   BMI 34.88 kg/m    Physical Exam  Constitutional: He is oriented to person, place, and time. He appears well-developed and well-nourished.  HENT:  Head: Normocephalic and atraumatic.  Right Ear: External ear normal.  Left Ear: External ear normal.  Mouth/Throat: Oropharynx is clear and moist.  Bilateral nares erythematous and edematous with clear nasal drainage noted.  Eyes normal.  Ears normal.  Pharynx normal.  Eyes: Conjunctivae are normal.  Neck: Normal range of motion. Neck supple.  Cardiovascular: Normal rate, regular rhythm and normal heart sounds.  S1-S2 normal.  Regular heart rate and rhythm.  No murmur noted.  Pulmonary/Chest: Effort normal and breath sounds normal.  Bilateral lungs with scattered expiratory wheeze.  Musculoskeletal: Normal range of motion.  Neurological: He is alert and oriented to person, place, and time.  Skin: Skin is warm and dry.  Psychiatric: He has a normal mood and affect. His behavior is normal. Judgment and thought content normal.    Diagnostics: FVC 2.28, FEV1 1.44.  Predicted FVC 3.67, predicted FEV1 2.96.  Spirometry reveals mild airway obstruction and moderate restriction.  Assessment and Plan: 1. Severe persistent asthma without complication   2. Seasonal and perennial allergic rhinitis      Meds ordered this encounter  Medications  . mometasone-formoterol (DULERA) 200-5 MCG/ACT AERO    Sig: Inhale 2 puffs into the lungs 2 (two) times daily.    Dispense:  1 Inhaler    Refill:  5  . albuterol (PROAIR HFA) 108 (90 Base) MCG/ACT inhaler    Sig: Inhale 2 puffs into the lungs every 4 (four) hours as needed for wheezing or shortness of breath.    Dispense:  1 Inhaler    Refill:  2  . montelukast (SINGULAIR) 10 MG tablet    Sig: Take 1 tablet once a day to prevent cough or wheeze    Dispense:  30 tablet    Refill:  5  . fluticasone (FLONASE) 50 MCG/ACT nasal spray    Sig: Place 2 sprays into both nostrils daily.    Dispense:  16 g    Refill:  5    Patient Instructions  Begin Dulera 200- 2 puffs twice a day to prevent cough, wheeze, and shortness of breath ProAir 2 puffs every 4 hours as needed for cough, wheeze, and shortness of breath Continue montelukast 10 mg once a day to prevent cough, wheeze, and shortness of breath Continue Xolair injections once every 2 weeks Continue allergen immunotherapy once a week Begin Flonase nasal spray 2 sprays once a day for the next 3 months to control stuffy nose Begin cetirizine 10 mg once a day for the next 3 months to control runny nose  Begin nasal saline rinses daily before using any medicated nasal sprays  Continue the other medications as listed in your chart  Call me if this treatment plan is not working for you  Follow up in 3 months or sooner as needed    Return in about 3 months (around 01/10/2018), or if symptoms worsen or fail to improve.   Baldo DaubMarvin Doyon was seen in the clinic with Dr. Beaulah DinningBardelas today. Thank you for the opportunity to care for this patient.  Please do not hesitate to contact me with questions.  Thermon LeylandAnne Ambs, FNP [facility]   I have provided oversight concerning Thermon Leylandnne Ambs' evaluation and treatment of this patient's health issues addressed  during today's encounter. I agree with the assessment and therapeutic plan as outlined in the note.   Thank you for the opportunity to care for this patient.  Please do not hesitate to contact me with questions.  Tonette BihariJ. A. Rickeya Manus, M.D.  [facility]  [address] [place], [place] [address] [phone]

## 2017-10-10 NOTE — Addendum Note (Signed)
Addended by: Hetty BlendAMBS, Ayelet Gruenewald M on: 10/10/2017 01:31 PM   Modules accepted: Orders

## 2017-10-11 ENCOUNTER — Ambulatory Visit (INDEPENDENT_AMBULATORY_CARE_PROVIDER_SITE_OTHER): Payer: BLUE CROSS/BLUE SHIELD

## 2017-10-11 DIAGNOSIS — J309 Allergic rhinitis, unspecified: Secondary | ICD-10-CM | POA: Diagnosis not present

## 2017-10-18 ENCOUNTER — Telehealth: Payer: Self-pay | Admitting: Allergy

## 2017-10-18 NOTE — Telephone Encounter (Signed)
[employer] insurance will  Not cover Clifford SleightDulera for this patient. Please advise.

## 2017-10-19 MED ORDER — BUDESONIDE-FORMOTEROL FUMARATE 160-4.5 MCG/ACT IN AERO: 2.0000 | INHALATION_SPRAY | Freq: Two times a day (BID) | RESPIRATORY_TRACT | 5 refills | Status: DC

## 2017-10-19 NOTE — Addendum Note (Signed)
Addended by: Hetty BlendAMBS, Alcee Sipos M on: 10/19/2017 07:23 AM   Modules accepted: Orders

## 2017-10-19 NOTE — Telephone Encounter (Signed)
I will call in Symbicort 160. Can you please ask him if he has tried any other asthma controller inhalers?

## 2017-10-20 ENCOUNTER — Ambulatory Visit (INDEPENDENT_AMBULATORY_CARE_PROVIDER_SITE_OTHER): Payer: BLUE CROSS/BLUE SHIELD

## 2017-10-20 DIAGNOSIS — J309 Allergic rhinitis, unspecified: Secondary | ICD-10-CM

## 2017-10-23 ENCOUNTER — Telehealth: Payer: Self-pay | Admitting: Allergy

## 2017-10-23 ENCOUNTER — Other Ambulatory Visit: Payer: Self-pay | Admitting: Allergy

## 2017-10-23 MED ORDER — ALBUTEROL SULFATE (2.5 MG/3ML) 0.083% IN NEBU: 2.5000 mg | INHALATION_SOLUTION | RESPIRATORY_TRACT | 1 refills | Status: DC | PRN

## 2017-10-23 MED ORDER — ALBUTEROL SULFATE HFA 108 (90 BASE) MCG/ACT IN AERS: 2.0000 | INHALATION_SPRAY | RESPIRATORY_TRACT | 1 refills | Status: DC | PRN

## 2017-10-23 NOTE — Telephone Encounter (Signed)
Talked with patient and he said he has used Q-Var and Symbicort before. He said he did good on the Symbicort.Also faxed in albuterol nedulizer and the Po-Air.

## 2017-10-23 NOTE — Telephone Encounter (Signed)
done

## 2017-10-23 NOTE — Telephone Encounter (Signed)
Left message for patient to call office.  

## 2017-10-24 ENCOUNTER — Ambulatory Visit: Payer: BLUE CROSS/BLUE SHIELD

## 2017-10-25 ENCOUNTER — Ambulatory Visit (INDEPENDENT_AMBULATORY_CARE_PROVIDER_SITE_OTHER): Payer: BLUE CROSS/BLUE SHIELD | Admitting: *Deleted

## 2017-10-25 DIAGNOSIS — J4540 Moderate persistent asthma, uncomplicated: Secondary | ICD-10-CM

## 2017-10-26 DIAGNOSIS — J301 Allergic rhinitis due to pollen: Secondary | ICD-10-CM | POA: Diagnosis not present

## 2017-10-27 DIAGNOSIS — J3089 Other allergic rhinitis: Secondary | ICD-10-CM | POA: Diagnosis not present

## 2017-11-07 ENCOUNTER — Ambulatory Visit (INDEPENDENT_AMBULATORY_CARE_PROVIDER_SITE_OTHER): Payer: BLUE CROSS/BLUE SHIELD

## 2017-11-07 DIAGNOSIS — J309 Allergic rhinitis, unspecified: Secondary | ICD-10-CM

## 2017-11-08 ENCOUNTER — Ambulatory Visit (INDEPENDENT_AMBULATORY_CARE_PROVIDER_SITE_OTHER): Payer: BLUE CROSS/BLUE SHIELD | Admitting: *Deleted

## 2017-11-08 ENCOUNTER — Ambulatory Visit: Payer: Self-pay

## 2017-11-08 DIAGNOSIS — J4540 Moderate persistent asthma, uncomplicated: Secondary | ICD-10-CM | POA: Diagnosis not present

## 2017-11-14 ENCOUNTER — Ambulatory Visit (INDEPENDENT_AMBULATORY_CARE_PROVIDER_SITE_OTHER): Payer: BLUE CROSS/BLUE SHIELD

## 2017-11-14 DIAGNOSIS — J309 Allergic rhinitis, unspecified: Secondary | ICD-10-CM

## 2017-11-21 ENCOUNTER — Ambulatory Visit: Payer: BLUE CROSS/BLUE SHIELD | Admitting: Family Medicine

## 2017-11-21 DIAGNOSIS — J309 Allergic rhinitis, unspecified: Secondary | ICD-10-CM

## 2017-11-23 ENCOUNTER — Ambulatory Visit (INDEPENDENT_AMBULATORY_CARE_PROVIDER_SITE_OTHER): Payer: BLUE CROSS/BLUE SHIELD

## 2017-11-23 DIAGNOSIS — J4540 Moderate persistent asthma, uncomplicated: Secondary | ICD-10-CM | POA: Diagnosis not present

## 2017-11-27 ENCOUNTER — Ambulatory Visit (INDEPENDENT_AMBULATORY_CARE_PROVIDER_SITE_OTHER): Payer: BLUE CROSS/BLUE SHIELD

## 2017-11-27 ENCOUNTER — Other Ambulatory Visit: Payer: Self-pay

## 2017-11-27 DIAGNOSIS — J309 Allergic rhinitis, unspecified: Secondary | ICD-10-CM | POA: Diagnosis not present

## 2017-12-06 ENCOUNTER — Ambulatory Visit (INDEPENDENT_AMBULATORY_CARE_PROVIDER_SITE_OTHER): Payer: BLUE CROSS/BLUE SHIELD | Admitting: *Deleted

## 2017-12-06 DIAGNOSIS — J4540 Moderate persistent asthma, uncomplicated: Secondary | ICD-10-CM | POA: Diagnosis not present

## 2017-12-07 ENCOUNTER — Ambulatory Visit: Payer: Self-pay

## 2017-12-14 ENCOUNTER — Ambulatory Visit (INDEPENDENT_AMBULATORY_CARE_PROVIDER_SITE_OTHER): Payer: BLUE CROSS/BLUE SHIELD

## 2017-12-14 DIAGNOSIS — J309 Allergic rhinitis, unspecified: Secondary | ICD-10-CM | POA: Diagnosis not present

## 2017-12-20 ENCOUNTER — Ambulatory Visit (INDEPENDENT_AMBULATORY_CARE_PROVIDER_SITE_OTHER): Payer: BLUE CROSS/BLUE SHIELD

## 2017-12-20 DIAGNOSIS — J4540 Moderate persistent asthma, uncomplicated: Secondary | ICD-10-CM

## 2017-12-27 ENCOUNTER — Ambulatory Visit (INDEPENDENT_AMBULATORY_CARE_PROVIDER_SITE_OTHER): Payer: BLUE CROSS/BLUE SHIELD

## 2017-12-27 DIAGNOSIS — J309 Allergic rhinitis, unspecified: Secondary | ICD-10-CM

## 2018-01-03 ENCOUNTER — Ambulatory Visit (INDEPENDENT_AMBULATORY_CARE_PROVIDER_SITE_OTHER): Payer: BLUE CROSS/BLUE SHIELD

## 2018-01-03 DIAGNOSIS — J4540 Moderate persistent asthma, uncomplicated: Secondary | ICD-10-CM

## 2018-01-09 ENCOUNTER — Ambulatory Visit (INDEPENDENT_AMBULATORY_CARE_PROVIDER_SITE_OTHER): Payer: BLUE CROSS/BLUE SHIELD

## 2018-01-09 DIAGNOSIS — J309 Allergic rhinitis, unspecified: Secondary | ICD-10-CM | POA: Diagnosis not present

## 2018-01-17 ENCOUNTER — Ambulatory Visit (INDEPENDENT_AMBULATORY_CARE_PROVIDER_SITE_OTHER): Payer: BLUE CROSS/BLUE SHIELD | Admitting: *Deleted

## 2018-01-17 DIAGNOSIS — J4540 Moderate persistent asthma, uncomplicated: Secondary | ICD-10-CM | POA: Diagnosis not present

## 2018-01-19 ENCOUNTER — Ambulatory Visit (INDEPENDENT_AMBULATORY_CARE_PROVIDER_SITE_OTHER): Payer: BLUE CROSS/BLUE SHIELD

## 2018-01-19 DIAGNOSIS — J309 Allergic rhinitis, unspecified: Secondary | ICD-10-CM

## 2018-01-26 ENCOUNTER — Ambulatory Visit (INDEPENDENT_AMBULATORY_CARE_PROVIDER_SITE_OTHER): Payer: BLUE CROSS/BLUE SHIELD

## 2018-01-26 DIAGNOSIS — J309 Allergic rhinitis, unspecified: Secondary | ICD-10-CM

## 2018-01-31 ENCOUNTER — Ambulatory Visit (INDEPENDENT_AMBULATORY_CARE_PROVIDER_SITE_OTHER): Payer: BLUE CROSS/BLUE SHIELD

## 2018-01-31 DIAGNOSIS — J4540 Moderate persistent asthma, uncomplicated: Secondary | ICD-10-CM | POA: Diagnosis not present

## 2018-02-07 ENCOUNTER — Ambulatory Visit (INDEPENDENT_AMBULATORY_CARE_PROVIDER_SITE_OTHER): Payer: BLUE CROSS/BLUE SHIELD

## 2018-02-07 DIAGNOSIS — J309 Allergic rhinitis, unspecified: Secondary | ICD-10-CM

## 2018-02-14 ENCOUNTER — Ambulatory Visit (INDEPENDENT_AMBULATORY_CARE_PROVIDER_SITE_OTHER): Payer: BLUE CROSS/BLUE SHIELD

## 2018-02-14 DIAGNOSIS — J4540 Moderate persistent asthma, uncomplicated: Secondary | ICD-10-CM | POA: Diagnosis not present

## 2018-02-15 ENCOUNTER — Ambulatory Visit (INDEPENDENT_AMBULATORY_CARE_PROVIDER_SITE_OTHER): Payer: BLUE CROSS/BLUE SHIELD

## 2018-02-15 DIAGNOSIS — J309 Allergic rhinitis, unspecified: Secondary | ICD-10-CM | POA: Diagnosis not present

## 2018-02-21 ENCOUNTER — Ambulatory Visit (INDEPENDENT_AMBULATORY_CARE_PROVIDER_SITE_OTHER): Payer: BLUE CROSS/BLUE SHIELD

## 2018-02-21 DIAGNOSIS — J309 Allergic rhinitis, unspecified: Secondary | ICD-10-CM | POA: Diagnosis not present

## 2018-02-28 ENCOUNTER — Ambulatory Visit (INDEPENDENT_AMBULATORY_CARE_PROVIDER_SITE_OTHER): Payer: BLUE CROSS/BLUE SHIELD

## 2018-02-28 DIAGNOSIS — J4540 Moderate persistent asthma, uncomplicated: Secondary | ICD-10-CM | POA: Diagnosis not present

## 2018-03-02 ENCOUNTER — Ambulatory Visit (INDEPENDENT_AMBULATORY_CARE_PROVIDER_SITE_OTHER): Payer: BLUE CROSS/BLUE SHIELD | Admitting: *Deleted

## 2018-03-02 DIAGNOSIS — J309 Allergic rhinitis, unspecified: Secondary | ICD-10-CM

## 2018-03-06 ENCOUNTER — Ambulatory Visit (INDEPENDENT_AMBULATORY_CARE_PROVIDER_SITE_OTHER): Payer: BLUE CROSS/BLUE SHIELD

## 2018-03-06 DIAGNOSIS — J309 Allergic rhinitis, unspecified: Secondary | ICD-10-CM

## 2018-03-14 ENCOUNTER — Ambulatory Visit: Payer: Self-pay

## 2018-03-19 ENCOUNTER — Ambulatory Visit (INDEPENDENT_AMBULATORY_CARE_PROVIDER_SITE_OTHER): Payer: BLUE CROSS/BLUE SHIELD | Admitting: *Deleted

## 2018-03-19 DIAGNOSIS — J4540 Moderate persistent asthma, uncomplicated: Secondary | ICD-10-CM | POA: Diagnosis not present

## 2018-03-23 ENCOUNTER — Ambulatory Visit (INDEPENDENT_AMBULATORY_CARE_PROVIDER_SITE_OTHER): Payer: BLUE CROSS/BLUE SHIELD | Admitting: *Deleted

## 2018-03-23 DIAGNOSIS — J309 Allergic rhinitis, unspecified: Secondary | ICD-10-CM | POA: Diagnosis not present

## 2018-03-29 ENCOUNTER — Telehealth: Payer: Self-pay

## 2018-03-29 ENCOUNTER — Ambulatory Visit (INDEPENDENT_AMBULATORY_CARE_PROVIDER_SITE_OTHER): Payer: BLUE CROSS/BLUE SHIELD

## 2018-03-29 DIAGNOSIS — J309 Allergic rhinitis, unspecified: Secondary | ICD-10-CM | POA: Diagnosis not present

## 2018-03-29 NOTE — Telephone Encounter (Signed)
Pt. Is here for his immunotherapy injections.

## 2018-04-02 ENCOUNTER — Ambulatory Visit (INDEPENDENT_AMBULATORY_CARE_PROVIDER_SITE_OTHER): Payer: BLUE CROSS/BLUE SHIELD | Admitting: *Deleted

## 2018-04-02 DIAGNOSIS — J4540 Moderate persistent asthma, uncomplicated: Secondary | ICD-10-CM | POA: Diagnosis not present

## 2018-04-04 ENCOUNTER — Ambulatory Visit (INDEPENDENT_AMBULATORY_CARE_PROVIDER_SITE_OTHER): Payer: BLUE CROSS/BLUE SHIELD | Admitting: *Deleted

## 2018-04-04 DIAGNOSIS — J309 Allergic rhinitis, unspecified: Secondary | ICD-10-CM | POA: Diagnosis not present

## 2018-04-16 ENCOUNTER — Ambulatory Visit (INDEPENDENT_AMBULATORY_CARE_PROVIDER_SITE_OTHER): Payer: BLUE CROSS/BLUE SHIELD

## 2018-04-16 DIAGNOSIS — J309 Allergic rhinitis, unspecified: Secondary | ICD-10-CM | POA: Diagnosis not present

## 2018-04-17 ENCOUNTER — Ambulatory Visit: Payer: BLUE CROSS/BLUE SHIELD

## 2018-04-25 ENCOUNTER — Ambulatory Visit (INDEPENDENT_AMBULATORY_CARE_PROVIDER_SITE_OTHER): Payer: BLUE CROSS/BLUE SHIELD

## 2018-04-25 DIAGNOSIS — J309 Allergic rhinitis, unspecified: Secondary | ICD-10-CM

## 2018-05-02 ENCOUNTER — Ambulatory Visit (INDEPENDENT_AMBULATORY_CARE_PROVIDER_SITE_OTHER): Payer: BLUE CROSS/BLUE SHIELD | Admitting: *Deleted

## 2018-05-02 DIAGNOSIS — J309 Allergic rhinitis, unspecified: Secondary | ICD-10-CM | POA: Diagnosis not present

## 2018-05-18 ENCOUNTER — Ambulatory Visit (INDEPENDENT_AMBULATORY_CARE_PROVIDER_SITE_OTHER): Payer: BLUE CROSS/BLUE SHIELD

## 2018-05-18 DIAGNOSIS — J309 Allergic rhinitis, unspecified: Secondary | ICD-10-CM

## 2018-05-22 ENCOUNTER — Ambulatory Visit (INDEPENDENT_AMBULATORY_CARE_PROVIDER_SITE_OTHER): Payer: BLUE CROSS/BLUE SHIELD

## 2018-05-22 DIAGNOSIS — J309 Allergic rhinitis, unspecified: Secondary | ICD-10-CM

## 2018-06-04 ENCOUNTER — Ambulatory Visit (INDEPENDENT_AMBULATORY_CARE_PROVIDER_SITE_OTHER): Payer: BLUE CROSS/BLUE SHIELD

## 2018-06-04 DIAGNOSIS — J4540 Moderate persistent asthma, uncomplicated: Secondary | ICD-10-CM

## 2018-06-08 ENCOUNTER — Ambulatory Visit (INDEPENDENT_AMBULATORY_CARE_PROVIDER_SITE_OTHER): Payer: BLUE CROSS/BLUE SHIELD

## 2018-06-08 DIAGNOSIS — J309 Allergic rhinitis, unspecified: Secondary | ICD-10-CM | POA: Diagnosis not present

## 2018-06-13 ENCOUNTER — Ambulatory Visit (INDEPENDENT_AMBULATORY_CARE_PROVIDER_SITE_OTHER): Payer: BLUE CROSS/BLUE SHIELD

## 2018-06-13 DIAGNOSIS — J309 Allergic rhinitis, unspecified: Secondary | ICD-10-CM | POA: Diagnosis not present

## 2018-06-18 ENCOUNTER — Ambulatory Visit (INDEPENDENT_AMBULATORY_CARE_PROVIDER_SITE_OTHER): Payer: BLUE CROSS/BLUE SHIELD

## 2018-06-18 ENCOUNTER — Ambulatory Visit: Payer: Self-pay

## 2018-06-18 DIAGNOSIS — J4540 Moderate persistent asthma, uncomplicated: Secondary | ICD-10-CM | POA: Diagnosis not present

## 2018-06-22 ENCOUNTER — Ambulatory Visit (INDEPENDENT_AMBULATORY_CARE_PROVIDER_SITE_OTHER): Payer: BLUE CROSS/BLUE SHIELD

## 2018-06-22 DIAGNOSIS — J309 Allergic rhinitis, unspecified: Secondary | ICD-10-CM | POA: Diagnosis not present

## 2018-06-26 ENCOUNTER — Ambulatory Visit (INDEPENDENT_AMBULATORY_CARE_PROVIDER_SITE_OTHER): Payer: BLUE CROSS/BLUE SHIELD

## 2018-06-26 DIAGNOSIS — J309 Allergic rhinitis, unspecified: Secondary | ICD-10-CM

## 2018-07-02 ENCOUNTER — Ambulatory Visit: Payer: BLUE CROSS/BLUE SHIELD

## 2018-07-03 ENCOUNTER — Ambulatory Visit (INDEPENDENT_AMBULATORY_CARE_PROVIDER_SITE_OTHER): Payer: BLUE CROSS/BLUE SHIELD

## 2018-07-03 DIAGNOSIS — J309 Allergic rhinitis, unspecified: Secondary | ICD-10-CM

## 2018-07-04 ENCOUNTER — Ambulatory Visit (INDEPENDENT_AMBULATORY_CARE_PROVIDER_SITE_OTHER): Payer: BLUE CROSS/BLUE SHIELD

## 2018-07-04 DIAGNOSIS — J4540 Moderate persistent asthma, uncomplicated: Secondary | ICD-10-CM

## 2018-07-16 ENCOUNTER — Ambulatory Visit (INDEPENDENT_AMBULATORY_CARE_PROVIDER_SITE_OTHER): Payer: BLUE CROSS/BLUE SHIELD

## 2018-07-16 DIAGNOSIS — J309 Allergic rhinitis, unspecified: Secondary | ICD-10-CM

## 2018-07-18 ENCOUNTER — Ambulatory Visit (INDEPENDENT_AMBULATORY_CARE_PROVIDER_SITE_OTHER): Payer: BLUE CROSS/BLUE SHIELD

## 2018-07-18 DIAGNOSIS — J4540 Moderate persistent asthma, uncomplicated: Secondary | ICD-10-CM | POA: Diagnosis not present

## 2018-07-26 ENCOUNTER — Ambulatory Visit (INDEPENDENT_AMBULATORY_CARE_PROVIDER_SITE_OTHER): Payer: BLUE CROSS/BLUE SHIELD

## 2018-07-26 DIAGNOSIS — J309 Allergic rhinitis, unspecified: Secondary | ICD-10-CM

## 2018-08-03 ENCOUNTER — Ambulatory Visit: Payer: Self-pay

## 2018-08-10 ENCOUNTER — Ambulatory Visit (INDEPENDENT_AMBULATORY_CARE_PROVIDER_SITE_OTHER): Payer: BLUE CROSS/BLUE SHIELD | Admitting: *Deleted

## 2018-08-10 DIAGNOSIS — J4540 Moderate persistent asthma, uncomplicated: Secondary | ICD-10-CM | POA: Diagnosis not present

## 2018-08-16 ENCOUNTER — Ambulatory Visit (INDEPENDENT_AMBULATORY_CARE_PROVIDER_SITE_OTHER): Payer: BLUE CROSS/BLUE SHIELD

## 2018-08-16 DIAGNOSIS — J309 Allergic rhinitis, unspecified: Secondary | ICD-10-CM | POA: Diagnosis not present

## 2018-08-22 ENCOUNTER — Ambulatory Visit (INDEPENDENT_AMBULATORY_CARE_PROVIDER_SITE_OTHER): Payer: BLUE CROSS/BLUE SHIELD

## 2018-08-22 DIAGNOSIS — J309 Allergic rhinitis, unspecified: Secondary | ICD-10-CM

## 2018-08-24 ENCOUNTER — Ambulatory Visit (INDEPENDENT_AMBULATORY_CARE_PROVIDER_SITE_OTHER): Payer: BLUE CROSS/BLUE SHIELD | Admitting: *Deleted

## 2018-08-24 DIAGNOSIS — J4540 Moderate persistent asthma, uncomplicated: Secondary | ICD-10-CM

## 2018-08-30 ENCOUNTER — Ambulatory Visit (INDEPENDENT_AMBULATORY_CARE_PROVIDER_SITE_OTHER): Payer: BLUE CROSS/BLUE SHIELD | Admitting: *Deleted

## 2018-08-30 DIAGNOSIS — J309 Allergic rhinitis, unspecified: Secondary | ICD-10-CM

## 2018-09-06 ENCOUNTER — Ambulatory Visit (INDEPENDENT_AMBULATORY_CARE_PROVIDER_SITE_OTHER): Payer: BLUE CROSS/BLUE SHIELD

## 2018-09-06 DIAGNOSIS — J309 Allergic rhinitis, unspecified: Secondary | ICD-10-CM | POA: Diagnosis not present

## 2018-09-07 ENCOUNTER — Ambulatory Visit: Payer: Self-pay

## 2018-09-10 ENCOUNTER — Ambulatory Visit: Payer: Self-pay

## 2018-09-11 ENCOUNTER — Ambulatory Visit: Payer: Self-pay

## 2018-09-11 ENCOUNTER — Ambulatory Visit (INDEPENDENT_AMBULATORY_CARE_PROVIDER_SITE_OTHER): Payer: BLUE CROSS/BLUE SHIELD

## 2018-09-11 DIAGNOSIS — J4540 Moderate persistent asthma, uncomplicated: Secondary | ICD-10-CM

## 2018-09-13 ENCOUNTER — Ambulatory Visit (INDEPENDENT_AMBULATORY_CARE_PROVIDER_SITE_OTHER): Payer: BLUE CROSS/BLUE SHIELD

## 2018-09-13 DIAGNOSIS — J309 Allergic rhinitis, unspecified: Secondary | ICD-10-CM

## 2018-09-18 ENCOUNTER — Ambulatory Visit (INDEPENDENT_AMBULATORY_CARE_PROVIDER_SITE_OTHER): Payer: BLUE CROSS/BLUE SHIELD

## 2018-09-18 DIAGNOSIS — J309 Allergic rhinitis, unspecified: Secondary | ICD-10-CM

## 2018-09-20 NOTE — Progress Notes (Signed)
VIALS EXP 09-25-2019 

## 2018-09-25 ENCOUNTER — Ambulatory Visit (INDEPENDENT_AMBULATORY_CARE_PROVIDER_SITE_OTHER): Payer: BLUE CROSS/BLUE SHIELD

## 2018-09-25 DIAGNOSIS — J4540 Moderate persistent asthma, uncomplicated: Secondary | ICD-10-CM

## 2018-09-26 DIAGNOSIS — J301 Allergic rhinitis due to pollen: Secondary | ICD-10-CM

## 2018-09-28 ENCOUNTER — Ambulatory Visit (INDEPENDENT_AMBULATORY_CARE_PROVIDER_SITE_OTHER): Payer: BLUE CROSS/BLUE SHIELD | Admitting: *Deleted

## 2018-09-28 DIAGNOSIS — J309 Allergic rhinitis, unspecified: Secondary | ICD-10-CM

## 2018-10-01 ENCOUNTER — Telehealth: Payer: Self-pay | Admitting: *Deleted

## 2018-10-01 NOTE — Telephone Encounter (Signed)
Called and advised patient that his Ins. Had changed drug plan and where he was getting his Rx Xolair thru [facility] it will now be dispensed from [facility].  He will need to provide them his copay card info and he can give them auth. To ship for the remainder of the year

## 2018-10-05 ENCOUNTER — Ambulatory Visit (INDEPENDENT_AMBULATORY_CARE_PROVIDER_SITE_OTHER): Payer: BLUE CROSS/BLUE SHIELD

## 2018-10-05 DIAGNOSIS — J309 Allergic rhinitis, unspecified: Secondary | ICD-10-CM

## 2018-10-09 ENCOUNTER — Ambulatory Visit: Payer: Self-pay

## 2018-10-10 ENCOUNTER — Ambulatory Visit (INDEPENDENT_AMBULATORY_CARE_PROVIDER_SITE_OTHER): Payer: BLUE CROSS/BLUE SHIELD

## 2018-10-10 DIAGNOSIS — J4540 Moderate persistent asthma, uncomplicated: Secondary | ICD-10-CM | POA: Diagnosis not present

## 2018-10-12 ENCOUNTER — Ambulatory Visit (INDEPENDENT_AMBULATORY_CARE_PROVIDER_SITE_OTHER): Payer: BLUE CROSS/BLUE SHIELD | Admitting: *Deleted

## 2018-10-12 DIAGNOSIS — J309 Allergic rhinitis, unspecified: Secondary | ICD-10-CM

## 2018-10-24 ENCOUNTER — Other Ambulatory Visit: Payer: Self-pay

## 2018-10-24 ENCOUNTER — Ambulatory Visit (INDEPENDENT_AMBULATORY_CARE_PROVIDER_SITE_OTHER): Payer: BLUE CROSS/BLUE SHIELD

## 2018-10-24 DIAGNOSIS — J4540 Moderate persistent asthma, uncomplicated: Secondary | ICD-10-CM | POA: Diagnosis not present

## 2018-10-30 ENCOUNTER — Ambulatory Visit (INDEPENDENT_AMBULATORY_CARE_PROVIDER_SITE_OTHER): Payer: BLUE CROSS/BLUE SHIELD | Admitting: *Deleted

## 2018-10-30 DIAGNOSIS — J309 Allergic rhinitis, unspecified: Secondary | ICD-10-CM | POA: Diagnosis not present

## 2018-11-07 ENCOUNTER — Other Ambulatory Visit: Payer: Self-pay

## 2018-11-07 ENCOUNTER — Ambulatory Visit (INDEPENDENT_AMBULATORY_CARE_PROVIDER_SITE_OTHER): Payer: BLUE CROSS/BLUE SHIELD

## 2018-11-07 DIAGNOSIS — J4540 Moderate persistent asthma, uncomplicated: Secondary | ICD-10-CM | POA: Diagnosis not present

## 2018-11-13 ENCOUNTER — Ambulatory Visit (INDEPENDENT_AMBULATORY_CARE_PROVIDER_SITE_OTHER): Payer: BLUE CROSS/BLUE SHIELD

## 2018-11-13 DIAGNOSIS — J309 Allergic rhinitis, unspecified: Secondary | ICD-10-CM | POA: Diagnosis not present

## 2018-11-21 ENCOUNTER — Ambulatory Visit (INDEPENDENT_AMBULATORY_CARE_PROVIDER_SITE_OTHER): Payer: BLUE CROSS/BLUE SHIELD

## 2018-11-21 ENCOUNTER — Other Ambulatory Visit: Payer: Self-pay

## 2018-11-21 DIAGNOSIS — J4540 Moderate persistent asthma, uncomplicated: Secondary | ICD-10-CM | POA: Diagnosis not present

## 2018-11-23 ENCOUNTER — Ambulatory Visit (INDEPENDENT_AMBULATORY_CARE_PROVIDER_SITE_OTHER): Payer: BLUE CROSS/BLUE SHIELD

## 2018-11-23 DIAGNOSIS — J309 Allergic rhinitis, unspecified: Secondary | ICD-10-CM | POA: Diagnosis not present

## 2018-11-27 ENCOUNTER — Ambulatory Visit (INDEPENDENT_AMBULATORY_CARE_PROVIDER_SITE_OTHER): Payer: BLUE CROSS/BLUE SHIELD

## 2018-11-27 DIAGNOSIS — J309 Allergic rhinitis, unspecified: Secondary | ICD-10-CM | POA: Diagnosis not present

## 2018-12-05 ENCOUNTER — Ambulatory Visit (INDEPENDENT_AMBULATORY_CARE_PROVIDER_SITE_OTHER): Payer: BLUE CROSS/BLUE SHIELD

## 2018-12-05 ENCOUNTER — Other Ambulatory Visit: Payer: Self-pay

## 2018-12-05 DIAGNOSIS — J4540 Moderate persistent asthma, uncomplicated: Secondary | ICD-10-CM

## 2018-12-07 ENCOUNTER — Ambulatory Visit (INDEPENDENT_AMBULATORY_CARE_PROVIDER_SITE_OTHER): Payer: BLUE CROSS/BLUE SHIELD

## 2018-12-07 DIAGNOSIS — J309 Allergic rhinitis, unspecified: Secondary | ICD-10-CM | POA: Diagnosis not present

## 2018-12-19 ENCOUNTER — Other Ambulatory Visit: Payer: Self-pay

## 2018-12-19 ENCOUNTER — Ambulatory Visit (INDEPENDENT_AMBULATORY_CARE_PROVIDER_SITE_OTHER): Payer: BLUE CROSS/BLUE SHIELD

## 2018-12-19 DIAGNOSIS — J4540 Moderate persistent asthma, uncomplicated: Secondary | ICD-10-CM | POA: Diagnosis not present

## 2018-12-21 ENCOUNTER — Ambulatory Visit (INDEPENDENT_AMBULATORY_CARE_PROVIDER_SITE_OTHER): Payer: BLUE CROSS/BLUE SHIELD

## 2018-12-21 DIAGNOSIS — J309 Allergic rhinitis, unspecified: Secondary | ICD-10-CM | POA: Diagnosis not present

## 2018-12-26 ENCOUNTER — Ambulatory Visit (INDEPENDENT_AMBULATORY_CARE_PROVIDER_SITE_OTHER): Payer: BLUE CROSS/BLUE SHIELD

## 2018-12-26 DIAGNOSIS — J309 Allergic rhinitis, unspecified: Secondary | ICD-10-CM

## 2019-01-01 ENCOUNTER — Ambulatory Visit (INDEPENDENT_AMBULATORY_CARE_PROVIDER_SITE_OTHER): Payer: BLUE CROSS/BLUE SHIELD

## 2019-01-01 ENCOUNTER — Other Ambulatory Visit: Payer: Self-pay

## 2019-01-01 DIAGNOSIS — J309 Allergic rhinitis, unspecified: Secondary | ICD-10-CM | POA: Diagnosis not present

## 2019-01-02 ENCOUNTER — Ambulatory Visit: Payer: Self-pay

## 2019-01-03 ENCOUNTER — Ambulatory Visit (INDEPENDENT_AMBULATORY_CARE_PROVIDER_SITE_OTHER): Payer: BLUE CROSS/BLUE SHIELD

## 2019-01-03 DIAGNOSIS — J309 Allergic rhinitis, unspecified: Secondary | ICD-10-CM

## 2019-01-09 ENCOUNTER — Ambulatory Visit (INDEPENDENT_AMBULATORY_CARE_PROVIDER_SITE_OTHER): Payer: BLUE CROSS/BLUE SHIELD

## 2019-01-09 DIAGNOSIS — J309 Allergic rhinitis, unspecified: Secondary | ICD-10-CM | POA: Diagnosis not present

## 2019-01-15 ENCOUNTER — Ambulatory Visit (INDEPENDENT_AMBULATORY_CARE_PROVIDER_SITE_OTHER): Payer: BC Managed Care – PPO

## 2019-01-15 ENCOUNTER — Other Ambulatory Visit: Payer: Self-pay

## 2019-01-15 DIAGNOSIS — J4540 Moderate persistent asthma, uncomplicated: Secondary | ICD-10-CM

## 2019-01-16 ENCOUNTER — Telehealth: Payer: Self-pay | Admitting: *Deleted

## 2019-01-16 NOTE — Telephone Encounter (Signed)
Called to reorder patient Xolair and they advised he is out of copay at this time. They will try to reach out to patient to see if they can get him assistance.   If they cannot find assistance for copay patient will need to reach out to me to see if we can get him drug through [facility] patient assistance

## 2019-01-18 ENCOUNTER — Ambulatory Visit (INDEPENDENT_AMBULATORY_CARE_PROVIDER_SITE_OTHER): Payer: BC Managed Care – PPO

## 2019-01-18 DIAGNOSIS — J309 Allergic rhinitis, unspecified: Secondary | ICD-10-CM | POA: Diagnosis not present

## 2019-01-24 ENCOUNTER — Other Ambulatory Visit: Payer: Self-pay | Admitting: *Deleted

## 2019-01-24 ENCOUNTER — Ambulatory Visit (INDEPENDENT_AMBULATORY_CARE_PROVIDER_SITE_OTHER): Payer: BC Managed Care – PPO

## 2019-01-24 DIAGNOSIS — J309 Allergic rhinitis, unspecified: Secondary | ICD-10-CM | POA: Diagnosis not present

## 2019-01-29 ENCOUNTER — Other Ambulatory Visit: Payer: Self-pay

## 2019-01-29 ENCOUNTER — Ambulatory Visit (INDEPENDENT_AMBULATORY_CARE_PROVIDER_SITE_OTHER): Payer: BC Managed Care – PPO

## 2019-01-29 DIAGNOSIS — J4540 Moderate persistent asthma, uncomplicated: Secondary | ICD-10-CM

## 2019-01-30 DIAGNOSIS — J301 Allergic rhinitis due to pollen: Secondary | ICD-10-CM

## 2019-02-01 ENCOUNTER — Ambulatory Visit (INDEPENDENT_AMBULATORY_CARE_PROVIDER_SITE_OTHER): Payer: BC Managed Care – PPO

## 2019-02-01 ENCOUNTER — Telehealth: Payer: Self-pay

## 2019-02-01 DIAGNOSIS — J309 Allergic rhinitis, unspecified: Secondary | ICD-10-CM

## 2019-02-01 NOTE — Telephone Encounter (Signed)
Pt came in for his allergy injections. Once I administered the injections I then checked his temp it was 100.9 temporal and then again 100.7 temporal. I informed pt of this and He left. I called Pt and asked covid screening questions and he stated he has some chills,with off and on headache, pt had covid19 testing yesterday, and it was negative, after developing symptoms at work the day before.  Pt stated he would call his pcp about being retested now that he has developed the fever, and would call us back. Johnette wanted me to make you aware. I am also cc Johnette so one of [place] can follow up with him.

## 2019-02-04 NOTE — Telephone Encounter (Signed)
Called to check on patient this am. He has no chills, no fever and is feeling great for his 48th birthday today.  He never was retested.

## 2019-02-07 ENCOUNTER — Ambulatory Visit (INDEPENDENT_AMBULATORY_CARE_PROVIDER_SITE_OTHER): Payer: BC Managed Care – PPO

## 2019-02-07 DIAGNOSIS — J309 Allergic rhinitis, unspecified: Secondary | ICD-10-CM | POA: Diagnosis not present

## 2019-02-12 ENCOUNTER — Encounter: Payer: Self-pay | Admitting: Family Medicine

## 2019-02-12 ENCOUNTER — Ambulatory Visit: Payer: BC Managed Care – PPO | Admitting: Family Medicine

## 2019-02-12 ENCOUNTER — Other Ambulatory Visit: Payer: Self-pay

## 2019-02-12 ENCOUNTER — Ambulatory Visit (INDEPENDENT_AMBULATORY_CARE_PROVIDER_SITE_OTHER): Payer: BC Managed Care – PPO

## 2019-02-12 VITALS — BP 132/80 | HR 91 | Temp 98.7°F | Resp 18 | Ht 66.2 in | Wt 213.2 lb

## 2019-02-12 DIAGNOSIS — E119 Type 2 diabetes mellitus without complications: Secondary | ICD-10-CM | POA: Diagnosis not present

## 2019-02-12 DIAGNOSIS — J309 Allergic rhinitis, unspecified: Secondary | ICD-10-CM

## 2019-02-12 DIAGNOSIS — J4540 Moderate persistent asthma, uncomplicated: Secondary | ICD-10-CM

## 2019-02-12 MED ORDER — MONTELUKAST SODIUM 10 MG PO TABS: ORAL_TABLET | ORAL | 5 refills | Status: DC

## 2019-02-12 MED ORDER — DULERA 200-5 MCG/ACT IN AERO: INHALATION_SPRAY | RESPIRATORY_TRACT | 3 refills | Status: DC

## 2019-02-12 MED ORDER — FLUTICASONE PROPIONATE 50 MCG/ACT NA SUSP: 2.0000 | Freq: Every day | NASAL | 5 refills | Status: DC

## 2019-02-12 MED ORDER — CETIRIZINE HCL 10 MG PO TABS: 10.0000 mg | ORAL_TABLET | Freq: Every day | ORAL | 5 refills | Status: DC | PRN

## 2019-02-12 MED ORDER — ALBUTEROL SULFATE HFA 108 (90 BASE) MCG/ACT IN AERS: 2.0000 | INHALATION_SPRAY | RESPIRATORY_TRACT | 5 refills | Status: DC | PRN

## 2019-02-12 NOTE — Patient Instructions (Addendum)
Asthma Continue montelukast 10 mg once a day to prevent cough, wheeze, and shortness of breath ProAir 2 puffs every 4 hours as needed for cough, wheeze, and shortness of breath Continue Xolair 375 mg injections once every 2 weeks For asthma flares, begin Dulera 200- 2 puffs twice a day for 2 weeks or until cough and wheeze free.  Allergic rhinitis Continue Flonase nasal spray 2 sprays once a day for the next 3 months to control stuffy nose Continue cetirizine 10 mg once a day for the next 3 months to control runny nose  Begin nasal saline rinses as needed for nasal symptoms. Use this before using medicated nasal sprays Continue allergen immunotherapy once a week Continue the other medications as listed in your chart  Call me if this treatment plan is not working for you  Follow up in 6 months or sooner as needed

## 2019-02-12 NOTE — Progress Notes (Signed)
[address] [place] [address] Dept: [phone]  FOLLOW UP NOTE  Patient ID: Clifford Williamson, male    DOB: 10-Jan-1971  Age: 48 y.o. MRN: [id number] Date of Office Visit: 02/12/2019  Assessment  Chief Complaint: Asthma (Xolair)  HPI Clifford Williamson is a 48 year old male who presents to the clinic for a follow up visit.  At today's visit he reports his asthma has been well controlled with no shortness of breath, cough, or wheeze with activity or rest.  He is currently using albuterol 2 puffs before exercise and several times a week for rescue, and montelukast 10 mg once a day.  He reports he has not used Dulera or Advair or Spiriva for several years, however he reports in the same visit that he has received several samples of these medications from his primary care provider.  He continues Xolair injections once every other week.  He reports his asthma has significantly improved while receiving Xolair injections.  Allergic rhinitis is reported as well controlled with cetirizine and Flonase as needed.  He continues allergy injections once a week with no adverse reactions.  He reports his allergic rhinitis has significantly improved while continuing on allergen immunotherapy.  His current medications are listed in the chart.   Drug Allergies:  Allergies  Allergen Reactions  . Azithromycin Itching and Rash    Rash/ itching  . Nsaids Other (See Comments)    Other reaction(s): ASTHMA Other reaction(s): ASTHMA  . Aspirin Other (See Comments)    Other reaction(s): ASTHMA Triggers asthma  . Excedrin Extra Strength  [Aspirin-Acetaminophen-Caffeine]   . Ibuprofen   . Naproxen Sodium   . Other Other (See Comments)    Physical Exam: BP 132/80   Pulse 91   Ht 5' 6.2" (1.681 m)   Wt 213 lb 3.2 oz (96.7 kg)   SpO2 96%   BMI 34.20 kg/m    Physical Exam Vitals signs reviewed.  Constitutional:      Appearance: Normal appearance.  HENT:     Head: Normocephalic and atraumatic.   Right Ear: Tympanic membrane normal.     Left Ear: Tympanic membrane normal.     Nose:     Comments: Bilateral nares slightly erythematous and edematous with clear drainage noted.  Pharynx normal.  Ears normal.  Eyes normal.    Mouth/Throat:     Pharynx: Oropharynx is clear.  Eyes:     Conjunctiva/sclera: Conjunctivae normal.  Neck:     Musculoskeletal: Normal range of motion and neck supple.  Cardiovascular:     Rate and Rhythm: Normal rate and regular rhythm.     Heart sounds: Normal heart sounds. No murmur.  Pulmonary:     Effort: Pulmonary effort is normal.     Breath sounds: Normal breath sounds.     Comments: Lungs clear to auscultation Musculoskeletal: Normal range of motion.  Skin:    General: Skin is warm and dry.  Neurological:     Mental Status: He is alert and oriented to person, place, and time.  Psychiatric:        Mood and Affect: Mood normal.        Behavior: Behavior normal.        Thought Content: Thought content normal.        Judgment: Judgment normal.     Diagnostics: FVC 2.74, FEV1 1.91.  Predicted FVC 3.64, predicted FEV1 2.91 spirometry indicates mild restriction.  This is consistent with previous spirometry readings.  Assessment and Plan: 1.  Moderate persistent asthma without complication   2. Allergic rhinitis, unspecified seasonality, unspecified trigger   3. Type 2 diabetes mellitus without complication, unspecified whether long term insulin use (HCC)     Meds ordered this encounter  Medications  . mometasone-formoterol (DULERA) 200-5 MCG/ACT AERO    Sig: For asthma flare take 2 puffs twice a day with a spacer for 2 weeks or until cough and wheeze free    Dispense:  13 g    Refill:  3    Hold.  Patient will call when needed  . albuterol (PROAIR HFA) 108 (90 Base) MCG/ACT inhaler    Sig: Inhale 2 puffs into the lungs every 4 (four) hours as needed for wheezing or shortness of breath.    Dispense:  18 g    Refill:  5  . cetirizine (ZYRTEC)  10 MG tablet    Sig: Take 1 tablet (10 mg total) by mouth daily as needed for allergies.    Dispense:  31 tablet    Refill:  5  . fluticasone (FLONASE) 50 MCG/ACT nasal spray    Sig: Place 2 sprays into both nostrils daily.    Dispense:  16 g    Refill:  5  . montelukast (SINGULAIR) 10 MG tablet    Sig: Take 1 tablet once a day to prevent cough or wheeze    Dispense:  30 tablet    Refill:  5    Patient Instructions  Asthma Continue montelukast 10 mg once a day to prevent cough, wheeze, and shortness of breath ProAir 2 puffs every 4 hours as needed for cough, wheeze, and shortness of breath Continue Xolair 375 mg injections once every 2 weeks For asthma flares, begin Dulera 200- 2 puffs twice a day for 2 weeks or until cough and wheeze free.  Allergic rhinitis Continue Flonase nasal spray 2 sprays once a day for the next 3 months to control stuffy nose Continue cetirizine 10 mg once a day for the next 3 months to control runny nose  Begin nasal saline rinses as needed for nasal symptoms. Use this before using medicated nasal sprays Continue allergen immunotherapy once a week Continue the other medications as listed in your chart  Call me if this treatment plan is not working for you  Follow up in 6 months or sooner as needed    Return in about 6 months (around 08/15/2019), or if symptoms worsen or fail to improve.    Thank you for the opportunity to care for this patient.  Please do not hesitate to contact me with questions.  Thermon LeylandAnne Zayley Arras, FNP [facility]  Thank you for the opportunity to care for this patient.  Please do not hesitate to contact me with questions.  I have provided oversight concerning Thermon Leylandnne Niara Bunker' evaluation and treatment of this patient's health issues addressed during today's encounter. I agree with the assessment and therapeutic plan as outlined in the note.   Thank you for the opportunity to care for this patient.  Please do  not hesitate to contact me with questions.  Tonette BihariJ. A. Bardelas, M.D.  [facility] [address] [place], [place] [address] [phone]

## 2019-02-13 ENCOUNTER — Other Ambulatory Visit: Payer: Self-pay | Admitting: *Deleted

## 2019-02-13 MED ORDER — EPINEPHRINE 0.3 MG/0.3ML IJ SOAJ: 0.3000 mg | Freq: Once | INTRAMUSCULAR | 1 refills | Status: AC

## 2019-02-13 NOTE — Addendum Note (Signed)
Addended by: Katherina Right D on: 02/13/2019 10:49 AM   Modules accepted: Orders

## 2019-02-13 NOTE — Telephone Encounter (Signed)
Per Gareth Morgan called pt to find out if he wanted Epipen or AuviQ sent. Sent Epipen.

## 2019-02-20 ENCOUNTER — Ambulatory Visit (INDEPENDENT_AMBULATORY_CARE_PROVIDER_SITE_OTHER): Payer: BC Managed Care – PPO

## 2019-02-20 DIAGNOSIS — J309 Allergic rhinitis, unspecified: Secondary | ICD-10-CM | POA: Diagnosis not present

## 2019-02-26 ENCOUNTER — Ambulatory Visit: Payer: Self-pay

## 2019-02-27 ENCOUNTER — Ambulatory Visit (INDEPENDENT_AMBULATORY_CARE_PROVIDER_SITE_OTHER): Payer: BC Managed Care – PPO

## 2019-02-27 ENCOUNTER — Other Ambulatory Visit: Payer: Self-pay

## 2019-02-27 DIAGNOSIS — J4540 Moderate persistent asthma, uncomplicated: Secondary | ICD-10-CM

## 2019-03-01 ENCOUNTER — Ambulatory Visit (INDEPENDENT_AMBULATORY_CARE_PROVIDER_SITE_OTHER): Payer: BC Managed Care – PPO

## 2019-03-01 DIAGNOSIS — J309 Allergic rhinitis, unspecified: Secondary | ICD-10-CM

## 2019-03-08 ENCOUNTER — Ambulatory Visit (INDEPENDENT_AMBULATORY_CARE_PROVIDER_SITE_OTHER): Payer: BC Managed Care – PPO

## 2019-03-08 DIAGNOSIS — J309 Allergic rhinitis, unspecified: Secondary | ICD-10-CM | POA: Diagnosis not present

## 2019-03-13 ENCOUNTER — Other Ambulatory Visit: Payer: Self-pay

## 2019-03-13 ENCOUNTER — Ambulatory Visit (INDEPENDENT_AMBULATORY_CARE_PROVIDER_SITE_OTHER): Payer: BC Managed Care – PPO

## 2019-03-13 DIAGNOSIS — J4540 Moderate persistent asthma, uncomplicated: Secondary | ICD-10-CM

## 2019-03-18 ENCOUNTER — Ambulatory Visit (INDEPENDENT_AMBULATORY_CARE_PROVIDER_SITE_OTHER): Payer: BC Managed Care – PPO

## 2019-03-18 DIAGNOSIS — J309 Allergic rhinitis, unspecified: Secondary | ICD-10-CM | POA: Diagnosis not present

## 2019-03-27 ENCOUNTER — Ambulatory Visit (INDEPENDENT_AMBULATORY_CARE_PROVIDER_SITE_OTHER): Payer: BC Managed Care – PPO

## 2019-03-27 DIAGNOSIS — J4540 Moderate persistent asthma, uncomplicated: Secondary | ICD-10-CM

## 2019-03-29 ENCOUNTER — Ambulatory Visit (INDEPENDENT_AMBULATORY_CARE_PROVIDER_SITE_OTHER): Payer: BC Managed Care – PPO

## 2019-03-29 DIAGNOSIS — J309 Allergic rhinitis, unspecified: Secondary | ICD-10-CM

## 2019-04-02 ENCOUNTER — Ambulatory Visit (INDEPENDENT_AMBULATORY_CARE_PROVIDER_SITE_OTHER): Payer: BC Managed Care – PPO

## 2019-04-02 DIAGNOSIS — J309 Allergic rhinitis, unspecified: Secondary | ICD-10-CM | POA: Diagnosis not present

## 2019-04-10 ENCOUNTER — Ambulatory Visit (INDEPENDENT_AMBULATORY_CARE_PROVIDER_SITE_OTHER): Payer: BC Managed Care – PPO

## 2019-04-10 ENCOUNTER — Other Ambulatory Visit: Payer: Self-pay

## 2019-04-10 DIAGNOSIS — J4540 Moderate persistent asthma, uncomplicated: Secondary | ICD-10-CM

## 2019-04-12 ENCOUNTER — Ambulatory Visit (INDEPENDENT_AMBULATORY_CARE_PROVIDER_SITE_OTHER): Payer: BC Managed Care – PPO

## 2019-04-12 DIAGNOSIS — J309 Allergic rhinitis, unspecified: Secondary | ICD-10-CM | POA: Diagnosis not present

## 2019-04-18 ENCOUNTER — Ambulatory Visit (INDEPENDENT_AMBULATORY_CARE_PROVIDER_SITE_OTHER): Payer: BC Managed Care – PPO

## 2019-04-18 DIAGNOSIS — J309 Allergic rhinitis, unspecified: Secondary | ICD-10-CM

## 2019-04-24 ENCOUNTER — Other Ambulatory Visit: Payer: Self-pay

## 2019-04-24 ENCOUNTER — Ambulatory Visit (INDEPENDENT_AMBULATORY_CARE_PROVIDER_SITE_OTHER): Payer: BC Managed Care – PPO

## 2019-04-24 DIAGNOSIS — J4540 Moderate persistent asthma, uncomplicated: Secondary | ICD-10-CM

## 2019-04-30 ENCOUNTER — Ambulatory Visit (INDEPENDENT_AMBULATORY_CARE_PROVIDER_SITE_OTHER): Payer: BC Managed Care – PPO

## 2019-04-30 DIAGNOSIS — J309 Allergic rhinitis, unspecified: Secondary | ICD-10-CM | POA: Diagnosis not present

## 2019-05-08 ENCOUNTER — Ambulatory Visit (INDEPENDENT_AMBULATORY_CARE_PROVIDER_SITE_OTHER): Payer: BC Managed Care – PPO

## 2019-05-08 ENCOUNTER — Other Ambulatory Visit: Payer: Self-pay

## 2019-05-08 DIAGNOSIS — J4540 Moderate persistent asthma, uncomplicated: Secondary | ICD-10-CM | POA: Diagnosis not present

## 2019-05-14 ENCOUNTER — Ambulatory Visit (INDEPENDENT_AMBULATORY_CARE_PROVIDER_SITE_OTHER): Payer: BC Managed Care – PPO

## 2019-05-14 DIAGNOSIS — J309 Allergic rhinitis, unspecified: Secondary | ICD-10-CM | POA: Diagnosis not present

## 2019-05-21 NOTE — Progress Notes (Signed)
Exp 05/22/20 

## 2019-05-22 ENCOUNTER — Ambulatory Visit (INDEPENDENT_AMBULATORY_CARE_PROVIDER_SITE_OTHER): Payer: Self-pay

## 2019-05-22 ENCOUNTER — Other Ambulatory Visit: Payer: Self-pay

## 2019-05-22 DIAGNOSIS — J4540 Moderate persistent asthma, uncomplicated: Secondary | ICD-10-CM

## 2019-05-27 DIAGNOSIS — J301 Allergic rhinitis due to pollen: Secondary | ICD-10-CM

## 2019-05-28 ENCOUNTER — Ambulatory Visit (INDEPENDENT_AMBULATORY_CARE_PROVIDER_SITE_OTHER): Payer: Self-pay

## 2019-05-28 DIAGNOSIS — J309 Allergic rhinitis, unspecified: Secondary | ICD-10-CM

## 2019-06-04 ENCOUNTER — Ambulatory Visit: Payer: Self-pay

## 2019-06-04 ENCOUNTER — Ambulatory Visit (INDEPENDENT_AMBULATORY_CARE_PROVIDER_SITE_OTHER): Payer: Self-pay

## 2019-06-04 DIAGNOSIS — J309 Allergic rhinitis, unspecified: Secondary | ICD-10-CM

## 2019-06-05 ENCOUNTER — Ambulatory Visit: Payer: Self-pay

## 2019-06-06 ENCOUNTER — Other Ambulatory Visit: Payer: Self-pay

## 2019-06-06 ENCOUNTER — Ambulatory Visit: Payer: Self-pay

## 2019-06-06 ENCOUNTER — Ambulatory Visit (INDEPENDENT_AMBULATORY_CARE_PROVIDER_SITE_OTHER): Payer: Self-pay

## 2019-06-06 DIAGNOSIS — J4540 Moderate persistent asthma, uncomplicated: Secondary | ICD-10-CM

## 2019-06-10 ENCOUNTER — Other Ambulatory Visit: Payer: Self-pay | Admitting: *Deleted

## 2019-06-10 MED ORDER — XOLAIR 150 MG ~~LOC~~ SOLR: 375.0000 mg | SUBCUTANEOUS | 11 refills | Status: DC

## 2019-06-12 ENCOUNTER — Ambulatory Visit (INDEPENDENT_AMBULATORY_CARE_PROVIDER_SITE_OTHER): Payer: Self-pay

## 2019-06-12 DIAGNOSIS — J309 Allergic rhinitis, unspecified: Secondary | ICD-10-CM

## 2019-06-20 ENCOUNTER — Other Ambulatory Visit: Payer: Self-pay

## 2019-06-20 ENCOUNTER — Ambulatory Visit (INDEPENDENT_AMBULATORY_CARE_PROVIDER_SITE_OTHER): Payer: BC Managed Care – PPO

## 2019-06-20 DIAGNOSIS — J4540 Moderate persistent asthma, uncomplicated: Secondary | ICD-10-CM

## 2019-06-26 ENCOUNTER — Ambulatory Visit (INDEPENDENT_AMBULATORY_CARE_PROVIDER_SITE_OTHER): Payer: BC Managed Care – PPO

## 2019-06-26 DIAGNOSIS — J309 Allergic rhinitis, unspecified: Secondary | ICD-10-CM

## 2019-07-03 ENCOUNTER — Ambulatory Visit (INDEPENDENT_AMBULATORY_CARE_PROVIDER_SITE_OTHER): Payer: BC Managed Care – PPO

## 2019-07-03 DIAGNOSIS — J4540 Moderate persistent asthma, uncomplicated: Secondary | ICD-10-CM | POA: Diagnosis not present

## 2019-07-10 ENCOUNTER — Ambulatory Visit (INDEPENDENT_AMBULATORY_CARE_PROVIDER_SITE_OTHER): Payer: BC Managed Care – PPO

## 2019-07-10 DIAGNOSIS — J309 Allergic rhinitis, unspecified: Secondary | ICD-10-CM

## 2019-07-17 ENCOUNTER — Ambulatory Visit: Payer: Self-pay

## 2019-07-23 ENCOUNTER — Ambulatory Visit (INDEPENDENT_AMBULATORY_CARE_PROVIDER_SITE_OTHER): Payer: BC Managed Care – PPO

## 2019-07-23 DIAGNOSIS — J309 Allergic rhinitis, unspecified: Secondary | ICD-10-CM

## 2019-07-26 ENCOUNTER — Other Ambulatory Visit: Payer: Self-pay

## 2019-07-26 ENCOUNTER — Ambulatory Visit (INDEPENDENT_AMBULATORY_CARE_PROVIDER_SITE_OTHER): Payer: BC Managed Care – PPO

## 2019-07-26 DIAGNOSIS — J4540 Moderate persistent asthma, uncomplicated: Secondary | ICD-10-CM

## 2019-07-31 ENCOUNTER — Ambulatory Visit (INDEPENDENT_AMBULATORY_CARE_PROVIDER_SITE_OTHER): Payer: BC Managed Care – PPO

## 2019-07-31 DIAGNOSIS — J309 Allergic rhinitis, unspecified: Secondary | ICD-10-CM

## 2019-08-07 ENCOUNTER — Ambulatory Visit (INDEPENDENT_AMBULATORY_CARE_PROVIDER_SITE_OTHER): Payer: BC Managed Care – PPO

## 2019-08-07 DIAGNOSIS — J309 Allergic rhinitis, unspecified: Secondary | ICD-10-CM | POA: Diagnosis not present

## 2019-08-14 ENCOUNTER — Ambulatory Visit (INDEPENDENT_AMBULATORY_CARE_PROVIDER_SITE_OTHER): Payer: BC Managed Care – PPO

## 2019-08-14 ENCOUNTER — Other Ambulatory Visit: Payer: Self-pay

## 2019-08-14 DIAGNOSIS — J309 Allergic rhinitis, unspecified: Secondary | ICD-10-CM

## 2019-08-21 ENCOUNTER — Ambulatory Visit (INDEPENDENT_AMBULATORY_CARE_PROVIDER_SITE_OTHER): Payer: BC Managed Care – PPO

## 2019-08-21 DIAGNOSIS — J309 Allergic rhinitis, unspecified: Secondary | ICD-10-CM

## 2019-08-28 ENCOUNTER — Other Ambulatory Visit: Payer: Self-pay

## 2019-08-28 ENCOUNTER — Ambulatory Visit (INDEPENDENT_AMBULATORY_CARE_PROVIDER_SITE_OTHER): Payer: BC Managed Care – PPO

## 2019-08-28 DIAGNOSIS — J4540 Moderate persistent asthma, uncomplicated: Secondary | ICD-10-CM | POA: Diagnosis not present

## 2019-08-28 DIAGNOSIS — J309 Allergic rhinitis, unspecified: Secondary | ICD-10-CM

## 2019-09-04 ENCOUNTER — Ambulatory Visit (INDEPENDENT_AMBULATORY_CARE_PROVIDER_SITE_OTHER): Payer: BC Managed Care – PPO

## 2019-09-04 DIAGNOSIS — J309 Allergic rhinitis, unspecified: Secondary | ICD-10-CM | POA: Diagnosis not present

## 2019-09-11 ENCOUNTER — Other Ambulatory Visit: Payer: Self-pay

## 2019-09-11 ENCOUNTER — Ambulatory Visit (INDEPENDENT_AMBULATORY_CARE_PROVIDER_SITE_OTHER): Payer: BC Managed Care – PPO

## 2019-09-11 DIAGNOSIS — J4540 Moderate persistent asthma, uncomplicated: Secondary | ICD-10-CM | POA: Diagnosis not present

## 2019-09-18 ENCOUNTER — Ambulatory Visit (INDEPENDENT_AMBULATORY_CARE_PROVIDER_SITE_OTHER): Payer: BC Managed Care – PPO

## 2019-09-18 DIAGNOSIS — J309 Allergic rhinitis, unspecified: Secondary | ICD-10-CM

## 2019-09-18 NOTE — Progress Notes (Signed)
VIALS EXP 09-17-20 

## 2019-09-19 DIAGNOSIS — J301 Allergic rhinitis due to pollen: Secondary | ICD-10-CM | POA: Diagnosis not present

## 2019-09-25 ENCOUNTER — Other Ambulatory Visit: Payer: Self-pay

## 2019-09-25 ENCOUNTER — Ambulatory Visit (INDEPENDENT_AMBULATORY_CARE_PROVIDER_SITE_OTHER): Payer: BC Managed Care – PPO

## 2019-09-25 DIAGNOSIS — J4540 Moderate persistent asthma, uncomplicated: Secondary | ICD-10-CM

## 2019-10-02 ENCOUNTER — Ambulatory Visit (INDEPENDENT_AMBULATORY_CARE_PROVIDER_SITE_OTHER): Payer: BC Managed Care – PPO

## 2019-10-02 DIAGNOSIS — J309 Allergic rhinitis, unspecified: Secondary | ICD-10-CM

## 2019-10-09 ENCOUNTER — Ambulatory Visit (INDEPENDENT_AMBULATORY_CARE_PROVIDER_SITE_OTHER): Payer: BC Managed Care – PPO | Admitting: *Deleted

## 2019-10-09 DIAGNOSIS — J309 Allergic rhinitis, unspecified: Secondary | ICD-10-CM

## 2019-10-23 ENCOUNTER — Ambulatory Visit (INDEPENDENT_AMBULATORY_CARE_PROVIDER_SITE_OTHER): Payer: BC Managed Care – PPO

## 2019-10-23 ENCOUNTER — Other Ambulatory Visit: Payer: Self-pay

## 2019-10-23 DIAGNOSIS — J4540 Moderate persistent asthma, uncomplicated: Secondary | ICD-10-CM

## 2019-10-25 ENCOUNTER — Telehealth: Payer: Self-pay

## 2019-10-25 NOTE — Telephone Encounter (Signed)
Spoke with patient and told him he was due for his allergy injections so to come in on this upcoming Monday for his allergy injections.

## 2019-10-25 NOTE — Telephone Encounter (Signed)
Pt. Calling earlier wanting to know when his next allergy injection was due. I told pt. I would call him back letting him know. I called pt. Back and let him know he is due for his allergy injection today if he can get here by 3:30 or he can come in on Monday.

## 2019-10-28 ENCOUNTER — Ambulatory Visit (INDEPENDENT_AMBULATORY_CARE_PROVIDER_SITE_OTHER): Payer: BC Managed Care – PPO

## 2019-10-28 DIAGNOSIS — J309 Allergic rhinitis, unspecified: Secondary | ICD-10-CM | POA: Diagnosis not present

## 2019-11-07 ENCOUNTER — Ambulatory Visit: Payer: Self-pay

## 2019-11-12 ENCOUNTER — Other Ambulatory Visit: Payer: Self-pay

## 2019-11-12 ENCOUNTER — Ambulatory Visit (INDEPENDENT_AMBULATORY_CARE_PROVIDER_SITE_OTHER): Payer: BC Managed Care – PPO

## 2019-11-12 DIAGNOSIS — J4540 Moderate persistent asthma, uncomplicated: Secondary | ICD-10-CM

## 2019-11-19 ENCOUNTER — Ambulatory Visit (INDEPENDENT_AMBULATORY_CARE_PROVIDER_SITE_OTHER): Payer: BC Managed Care – PPO

## 2019-11-19 DIAGNOSIS — J309 Allergic rhinitis, unspecified: Secondary | ICD-10-CM

## 2019-11-26 ENCOUNTER — Other Ambulatory Visit: Payer: Self-pay

## 2019-11-26 ENCOUNTER — Ambulatory Visit: Payer: BC Managed Care – PPO | Admitting: *Deleted

## 2019-11-29 ENCOUNTER — Ambulatory Visit (INDEPENDENT_AMBULATORY_CARE_PROVIDER_SITE_OTHER): Payer: BC Managed Care – PPO | Admitting: *Deleted

## 2019-11-29 DIAGNOSIS — J309 Allergic rhinitis, unspecified: Secondary | ICD-10-CM

## 2019-12-04 ENCOUNTER — Ambulatory Visit (INDEPENDENT_AMBULATORY_CARE_PROVIDER_SITE_OTHER): Payer: BC Managed Care – PPO | Admitting: *Deleted

## 2019-12-04 DIAGNOSIS — J309 Allergic rhinitis, unspecified: Secondary | ICD-10-CM | POA: Diagnosis not present

## 2019-12-10 ENCOUNTER — Ambulatory Visit (INDEPENDENT_AMBULATORY_CARE_PROVIDER_SITE_OTHER): Payer: BC Managed Care – PPO

## 2019-12-10 ENCOUNTER — Other Ambulatory Visit: Payer: Self-pay

## 2019-12-10 DIAGNOSIS — J4540 Moderate persistent asthma, uncomplicated: Secondary | ICD-10-CM | POA: Diagnosis not present

## 2019-12-18 ENCOUNTER — Ambulatory Visit (INDEPENDENT_AMBULATORY_CARE_PROVIDER_SITE_OTHER): Payer: BC Managed Care – PPO | Admitting: *Deleted

## 2019-12-18 DIAGNOSIS — J309 Allergic rhinitis, unspecified: Secondary | ICD-10-CM | POA: Diagnosis not present

## 2019-12-24 ENCOUNTER — Ambulatory Visit (INDEPENDENT_AMBULATORY_CARE_PROVIDER_SITE_OTHER): Payer: BC Managed Care – PPO

## 2019-12-24 DIAGNOSIS — J4540 Moderate persistent asthma, uncomplicated: Secondary | ICD-10-CM | POA: Diagnosis not present

## 2019-12-27 ENCOUNTER — Ambulatory Visit (INDEPENDENT_AMBULATORY_CARE_PROVIDER_SITE_OTHER): Payer: BC Managed Care – PPO

## 2019-12-27 DIAGNOSIS — J309 Allergic rhinitis, unspecified: Secondary | ICD-10-CM | POA: Diagnosis not present

## 2019-12-31 ENCOUNTER — Ambulatory Visit (INDEPENDENT_AMBULATORY_CARE_PROVIDER_SITE_OTHER): Payer: BC Managed Care – PPO

## 2019-12-31 DIAGNOSIS — J309 Allergic rhinitis, unspecified: Secondary | ICD-10-CM

## 2020-01-08 ENCOUNTER — Other Ambulatory Visit: Payer: Self-pay

## 2020-01-08 ENCOUNTER — Ambulatory Visit (INDEPENDENT_AMBULATORY_CARE_PROVIDER_SITE_OTHER): Payer: BC Managed Care – PPO

## 2020-01-08 DIAGNOSIS — J4540 Moderate persistent asthma, uncomplicated: Secondary | ICD-10-CM

## 2020-01-14 ENCOUNTER — Ambulatory Visit (INDEPENDENT_AMBULATORY_CARE_PROVIDER_SITE_OTHER): Payer: BC Managed Care – PPO

## 2020-01-14 DIAGNOSIS — J309 Allergic rhinitis, unspecified: Secondary | ICD-10-CM

## 2020-01-22 ENCOUNTER — Ambulatory Visit (INDEPENDENT_AMBULATORY_CARE_PROVIDER_SITE_OTHER): Payer: BC Managed Care – PPO

## 2020-01-22 DIAGNOSIS — J4540 Moderate persistent asthma, uncomplicated: Secondary | ICD-10-CM

## 2020-01-29 ENCOUNTER — Ambulatory Visit (INDEPENDENT_AMBULATORY_CARE_PROVIDER_SITE_OTHER): Payer: BC Managed Care – PPO

## 2020-01-29 DIAGNOSIS — J309 Allergic rhinitis, unspecified: Secondary | ICD-10-CM

## 2020-02-05 ENCOUNTER — Ambulatory Visit (INDEPENDENT_AMBULATORY_CARE_PROVIDER_SITE_OTHER): Payer: BC Managed Care – PPO

## 2020-02-05 ENCOUNTER — Other Ambulatory Visit: Payer: Self-pay

## 2020-02-05 DIAGNOSIS — J4540 Moderate persistent asthma, uncomplicated: Secondary | ICD-10-CM

## 2020-02-11 ENCOUNTER — Ambulatory Visit (INDEPENDENT_AMBULATORY_CARE_PROVIDER_SITE_OTHER): Payer: BC Managed Care – PPO

## 2020-02-11 DIAGNOSIS — J309 Allergic rhinitis, unspecified: Secondary | ICD-10-CM | POA: Diagnosis not present

## 2020-02-18 ENCOUNTER — Other Ambulatory Visit: Payer: Self-pay

## 2020-02-18 ENCOUNTER — Ambulatory Visit (INDEPENDENT_AMBULATORY_CARE_PROVIDER_SITE_OTHER): Payer: BC Managed Care – PPO | Admitting: *Deleted

## 2020-02-18 DIAGNOSIS — J4540 Moderate persistent asthma, uncomplicated: Secondary | ICD-10-CM

## 2020-02-25 ENCOUNTER — Ambulatory Visit (INDEPENDENT_AMBULATORY_CARE_PROVIDER_SITE_OTHER): Payer: BC Managed Care – PPO | Admitting: *Deleted

## 2020-02-25 DIAGNOSIS — J309 Allergic rhinitis, unspecified: Secondary | ICD-10-CM

## 2020-02-25 NOTE — Progress Notes (Signed)
VIALS EXP 02-24-21 

## 2020-02-27 DIAGNOSIS — J301 Allergic rhinitis due to pollen: Secondary | ICD-10-CM

## 2020-03-10 ENCOUNTER — Ambulatory Visit (INDEPENDENT_AMBULATORY_CARE_PROVIDER_SITE_OTHER): Payer: BC Managed Care – PPO

## 2020-03-10 ENCOUNTER — Other Ambulatory Visit: Payer: Self-pay

## 2020-03-10 DIAGNOSIS — J4540 Moderate persistent asthma, uncomplicated: Secondary | ICD-10-CM

## 2020-03-12 ENCOUNTER — Ambulatory Visit: Payer: BC Managed Care – PPO | Admitting: Family

## 2020-03-18 ENCOUNTER — Ambulatory Visit (INDEPENDENT_AMBULATORY_CARE_PROVIDER_SITE_OTHER): Payer: BC Managed Care – PPO | Admitting: *Deleted

## 2020-03-18 DIAGNOSIS — J309 Allergic rhinitis, unspecified: Secondary | ICD-10-CM | POA: Diagnosis not present

## 2020-03-25 ENCOUNTER — Ambulatory Visit (INDEPENDENT_AMBULATORY_CARE_PROVIDER_SITE_OTHER): Payer: BC Managed Care – PPO | Admitting: *Deleted

## 2020-03-25 DIAGNOSIS — J4540 Moderate persistent asthma, uncomplicated: Secondary | ICD-10-CM

## 2020-03-26 ENCOUNTER — Ambulatory Visit: Payer: BC Managed Care – PPO | Admitting: Family

## 2020-04-01 ENCOUNTER — Encounter: Payer: Self-pay | Admitting: Family Medicine

## 2020-04-01 ENCOUNTER — Ambulatory Visit: Payer: BC Managed Care – PPO | Admitting: Family Medicine

## 2020-04-01 ENCOUNTER — Ambulatory Visit: Payer: Self-pay | Admitting: *Deleted

## 2020-04-01 ENCOUNTER — Other Ambulatory Visit: Payer: Self-pay

## 2020-04-01 VITALS — BP 104/70 | HR 98 | Temp 98.2°F | Resp 20 | Ht 66.61 in | Wt 219.6 lb

## 2020-04-01 DIAGNOSIS — K219 Gastro-esophageal reflux disease without esophagitis: Secondary | ICD-10-CM

## 2020-04-01 DIAGNOSIS — J309 Allergic rhinitis, unspecified: Secondary | ICD-10-CM

## 2020-04-01 DIAGNOSIS — J302 Other seasonal allergic rhinitis: Secondary | ICD-10-CM | POA: Diagnosis not present

## 2020-04-01 DIAGNOSIS — J3089 Other allergic rhinitis: Secondary | ICD-10-CM | POA: Diagnosis not present

## 2020-04-01 DIAGNOSIS — J4540 Moderate persistent asthma, uncomplicated: Secondary | ICD-10-CM | POA: Insufficient documentation

## 2020-04-01 MED ORDER — EPINEPHRINE 0.3 MG/0.3ML IJ SOAJ: INTRAMUSCULAR | 3 refills | Status: DC

## 2020-04-01 NOTE — Progress Notes (Addendum)
[address] [place] [address] Dept: [phone]  FOLLOW UP NOTE  Patient ID: Gabrielle Mester, male    DOB: September 05, 1970  Age: 49 y.o. MRN: [id number] Date of Office Visit: 04/01/2020  Assessment  Chief Complaint: Asthma  HPI Coston Mandato is a 49 year old male who presents to the clinic for a follow up visit. He was last seen in this clinic on 02/12/2019 for evaluation of asthma and allergic rhinitis.  At today's visit, he reports his asthma has been well controlled with no shortness of breath, cough, or wheeze with activity or rest.  He continues montelukast 10 mg once a day and Xolair 375 mg once every 2 weeks.  He reports that he has not used Dulera or albuterol in the time since his last visit to this clinic.  Allergic rhinitis is reported as moderately well controlled with frequent nasal congestion occurring mostly overnight.  He continues Allegra as needed, Flonase as needed, and is not using a saline nasal rinse at this time.  He continues allergen immunotherapy with no large local reactions.  He reports a significant decrease in his symptoms of allergic rhinitis while continuing on allergen immunotherapy.  He reports reflux has been well controlled with symptoms of heartburn occurring if he eats certain foods or if he eats late at night.  He continues omeprazole 20 mg once a day.  His current medications are listed in the chart.   Drug Allergies:  Allergies  Allergen Reactions  . Azithromycin Itching and Rash    Rash/ itching  . Nsaids Other (See Comments)    Other reaction(s): ASTHMA Other reaction(s): ASTHMA  . Aspirin Other (See Comments)    Other reaction(s): ASTHMA Triggers asthma  . Excedrin Extra Strength  [Aspirin-Acetaminophen-Caffeine]   . Ibuprofen   . Naproxen Sodium     Physical Exam: BP 104/70   Pulse 98   Temp 98.2 F (36.8 C) (Oral)   Resp 20   Ht 5' 6.61" (1.692 m)   Wt 219 lb 9.6 oz (99.6 kg)   SpO2 97%   BMI 34.79 kg/m    Physical  Exam Vitals reviewed.  Constitutional:      Appearance: Normal appearance.  HENT:     Head: Normocephalic and atraumatic.     Right Ear: Tympanic membrane normal.     Left Ear: Tympanic membrane normal.     Nose:     Comments: Bilateral nares edematous and pale with clear nasal drainage noted.  Pharynx normal.  Ears normal.  Eyes normal.    Mouth/Throat:     Pharynx: Oropharynx is clear.  Eyes:     Conjunctiva/sclera: Conjunctivae normal.  Cardiovascular:     Rate and Rhythm: Normal rate and regular rhythm.     Heart sounds: Normal heart sounds. No murmur heard.   Pulmonary:     Effort: Pulmonary effort is normal.     Breath sounds: Normal breath sounds.     Comments: Lungs clear to auscultation Musculoskeletal:        General: Normal range of motion.     Cervical back: Normal range of motion and neck supple.  Skin:    General: Skin is warm and dry.  Neurological:     Mental Status: He is alert and oriented to person, place, and time.  Psychiatric:        Mood and Affect: Mood normal.        Behavior: Behavior normal.        Thought Content:  Thought content normal.        Judgment: Judgment normal.    Diagnostics: FVC 2.64, FEV1 1.87.  Predicted FVC 3.73, predicted FEV1 2.98.  Spirometry indicates mild restriction.  This is consistent with previous spirometry readings.  Assessment and Plan: 1. Moderate persistent asthma without complication   2. Seasonal and perennial allergic rhinitis   3. Gastroesophageal reflux disease, unspecified whether esophagitis present     Meds ordered this encounter  Medications  . EPINEPHrine (AUVI-Q) 0.3 mg/0.3 mL IJ SOAJ injection    Sig: Use as directed for severe allergic reaction    Dispense:  2 each    Refill:  3    Patient Instructions  Asthma Continue montelukast 10 mg once a day to prevent cough, wheeze, and shortness of breath ProAir 2 puffs every 4 hours as needed for cough, wheeze, and shortness of breath Continue  Xolair 375 mg injections once every 2 weeks and have access to an epinephrine auto-injector set For asthma flares, begin Dulera 200- 2 puffs twice a day for 2 weeks or until cough and wheeze free.  Allergic rhinitis Continue Flonase nasal spray 2 sprays once a day to control stuffy nose.  In the right nostril, point the applicator out toward the right ear. In the left nostril, point the applicator out toward the left ear Continue Allegra 180 mg once a day as needed for a runny nose Begin nasal saline rinses as needed for nasal symptoms. Use this before using medicated nasal sprays Continue allergen immunotherapy once every 2 weeks  Reflux Continue omeprazole 20 mg once a day as previously prescribed Continue dietary and lifestyle modifications as listed below  Continue the other medications as listed in your chart  Call me if this treatment plan is not working for you  Follow up in 6 months or sooner as needed   Return in about 6 months (around 10/02/2020), or if symptoms worsen or fail to improve.    Thank you for the opportunity to care for this patient.  Please do not hesitate to contact me with questions.  Thermon Leyland, FNP [facility]   ________________________________________________  I have provided oversight concerning Thurston Hole Amb's evaluation and treatment of this patient's health issues addressed during today's encounter.  I agree with the assessment and therapeutic plan as outlined in the note.   Signed,   R Jorene Guest, MD

## 2020-04-01 NOTE — Patient Instructions (Addendum)
Asthma Continue montelukast 10 mg once a day to prevent cough, wheeze, and shortness of breath ProAir 2 puffs every 4 hours as needed for cough, wheeze, and shortness of breath Continue Xolair 375 mg injections once every 2 weeks and have access to an epinephrine auto-injector set For asthma flares, begin Dulera 200- 2 puffs twice a day for 2 weeks or until cough and wheeze free.  Allergic rhinitis Continue Flonase nasal spray 2 sprays once a day to control stuffy nose.  In the right nostril, point the applicator out toward the right ear. In the left nostril, point the applicator out toward the left ear Continue Allegra 180 mg once a day as needed for a runny nose Begin nasal saline rinses as needed for nasal symptoms. Use this before using medicated nasal sprays Continue allergen immunotherapy once every 2 weeks  Reflux Continue omeprazole 20 mg once a day as previously prescribed Continue dietary and lifestyle modifications as listed below  Continue the other medications as listed in your chart  Call me if this treatment plan is not working for you  Follow up in 6 months or sooner as needed   Lifestyle Changes for Controlling GERD When you have GERD, stomach acid feels as if it's backing up toward your mouth. Whether or not you take medication to control your GERD, your symptoms can often be improved with lifestyle changes.   Raise Your Head  Reflux is more likely to strike when you're lying down flat, because stomach fluid can  flow backward more easily. Raising the head of your bed 4-6 inches can help. To do this:  Slide blocks or books under the legs at the head of your bed. Or, place a wedge under  the mattress. Many foam stores can make a suitable wedge for you. The wedge  should run from your waist to the top of your head.  Don't just prop your head on several pillows. This increases pressure on your  stomach. It can make GERD worse.  Watch Your Eating  Habits Certain foods may increase the acid in your stomach or relax the lower esophageal sphincter, making GERD more likely. It's best to avoid the following:  Coffee, tea, and carbonated drinks (with and without caffeine)  Fatty, fried, or spicy food  Mint, chocolate, onions, and tomatoes  Any other foods that seem to irritate your stomach or cause you pain  Relieve the Pressure  Eat smaller meals, even if you have to eat more often.  Don't lie down right after you eat. Wait a few hours for your stomach to empty.  Avoid tight belts and tight-fitting clothes.  Lose excess weight.  Tobacco and Alcohol  Avoid smoking tobacco and drinking alcohol. They can make GERD symptoms worse.

## 2020-04-08 ENCOUNTER — Ambulatory Visit (INDEPENDENT_AMBULATORY_CARE_PROVIDER_SITE_OTHER): Payer: 59

## 2020-04-08 ENCOUNTER — Other Ambulatory Visit: Payer: Self-pay

## 2020-04-08 DIAGNOSIS — J4540 Moderate persistent asthma, uncomplicated: Secondary | ICD-10-CM | POA: Diagnosis not present

## 2020-04-10 ENCOUNTER — Ambulatory Visit (INDEPENDENT_AMBULATORY_CARE_PROVIDER_SITE_OTHER): Payer: 59 | Admitting: *Deleted

## 2020-04-10 DIAGNOSIS — J309 Allergic rhinitis, unspecified: Secondary | ICD-10-CM | POA: Diagnosis not present

## 2020-04-17 ENCOUNTER — Ambulatory Visit (INDEPENDENT_AMBULATORY_CARE_PROVIDER_SITE_OTHER): Payer: 59 | Admitting: *Deleted

## 2020-04-17 DIAGNOSIS — J309 Allergic rhinitis, unspecified: Secondary | ICD-10-CM | POA: Diagnosis not present

## 2020-04-22 ENCOUNTER — Other Ambulatory Visit: Payer: Self-pay

## 2020-04-22 ENCOUNTER — Ambulatory Visit (INDEPENDENT_AMBULATORY_CARE_PROVIDER_SITE_OTHER): Payer: 59

## 2020-04-22 DIAGNOSIS — J4540 Moderate persistent asthma, uncomplicated: Secondary | ICD-10-CM | POA: Diagnosis not present

## 2020-04-24 ENCOUNTER — Ambulatory Visit (INDEPENDENT_AMBULATORY_CARE_PROVIDER_SITE_OTHER): Payer: 59

## 2020-04-24 DIAGNOSIS — J309 Allergic rhinitis, unspecified: Secondary | ICD-10-CM

## 2020-04-29 ENCOUNTER — Ambulatory Visit (INDEPENDENT_AMBULATORY_CARE_PROVIDER_SITE_OTHER): Payer: 59

## 2020-04-29 DIAGNOSIS — J309 Allergic rhinitis, unspecified: Secondary | ICD-10-CM

## 2020-05-06 ENCOUNTER — Ambulatory Visit: Payer: 59

## 2020-05-07 ENCOUNTER — Ambulatory Visit: Payer: 59

## 2020-05-08 ENCOUNTER — Ambulatory Visit (INDEPENDENT_AMBULATORY_CARE_PROVIDER_SITE_OTHER): Payer: 59

## 2020-05-08 ENCOUNTER — Ambulatory Visit: Payer: 59

## 2020-05-08 ENCOUNTER — Other Ambulatory Visit: Payer: Self-pay

## 2020-05-08 DIAGNOSIS — J4540 Moderate persistent asthma, uncomplicated: Secondary | ICD-10-CM

## 2020-05-15 ENCOUNTER — Ambulatory Visit (INDEPENDENT_AMBULATORY_CARE_PROVIDER_SITE_OTHER): Payer: 59 | Admitting: *Deleted

## 2020-05-15 DIAGNOSIS — J309 Allergic rhinitis, unspecified: Secondary | ICD-10-CM

## 2020-05-21 ENCOUNTER — Other Ambulatory Visit: Payer: Self-pay

## 2020-05-21 ENCOUNTER — Ambulatory Visit (INDEPENDENT_AMBULATORY_CARE_PROVIDER_SITE_OTHER): Payer: 59

## 2020-05-21 DIAGNOSIS — J4540 Moderate persistent asthma, uncomplicated: Secondary | ICD-10-CM | POA: Diagnosis not present

## 2020-06-03 ENCOUNTER — Ambulatory Visit: Payer: Self-pay

## 2020-06-04 ENCOUNTER — Ambulatory Visit (INDEPENDENT_AMBULATORY_CARE_PROVIDER_SITE_OTHER): Payer: 59

## 2020-06-04 ENCOUNTER — Other Ambulatory Visit: Payer: Self-pay

## 2020-06-04 DIAGNOSIS — J4540 Moderate persistent asthma, uncomplicated: Secondary | ICD-10-CM

## 2020-06-08 ENCOUNTER — Ambulatory Visit (INDEPENDENT_AMBULATORY_CARE_PROVIDER_SITE_OTHER): Payer: 59

## 2020-06-08 DIAGNOSIS — J309 Allergic rhinitis, unspecified: Secondary | ICD-10-CM

## 2020-06-16 ENCOUNTER — Other Ambulatory Visit: Payer: Self-pay

## 2020-06-16 ENCOUNTER — Ambulatory Visit (INDEPENDENT_AMBULATORY_CARE_PROVIDER_SITE_OTHER): Payer: 59 | Admitting: *Deleted

## 2020-06-16 DIAGNOSIS — J4540 Moderate persistent asthma, uncomplicated: Secondary | ICD-10-CM

## 2020-06-18 ENCOUNTER — Ambulatory Visit: Payer: Self-pay

## 2020-06-18 ENCOUNTER — Other Ambulatory Visit: Payer: Self-pay | Admitting: *Deleted

## 2020-06-18 DIAGNOSIS — J301 Allergic rhinitis due to pollen: Secondary | ICD-10-CM

## 2020-06-18 MED ORDER — XOLAIR 150 MG ~~LOC~~ SOLR
375.0000 mg | SUBCUTANEOUS | 11 refills | Status: DC
Start: 2020-06-18 — End: 2021-08-30

## 2020-06-18 NOTE — Progress Notes (Signed)
Vials exp 06-18-21 °

## 2020-06-30 ENCOUNTER — Other Ambulatory Visit: Payer: Self-pay

## 2020-06-30 ENCOUNTER — Ambulatory Visit (INDEPENDENT_AMBULATORY_CARE_PROVIDER_SITE_OTHER): Payer: 59

## 2020-06-30 DIAGNOSIS — J4540 Moderate persistent asthma, uncomplicated: Secondary | ICD-10-CM

## 2020-07-14 ENCOUNTER — Other Ambulatory Visit: Payer: Self-pay

## 2020-07-14 ENCOUNTER — Ambulatory Visit (INDEPENDENT_AMBULATORY_CARE_PROVIDER_SITE_OTHER): Payer: 59

## 2020-07-14 DIAGNOSIS — J4540 Moderate persistent asthma, uncomplicated: Secondary | ICD-10-CM

## 2020-07-17 ENCOUNTER — Ambulatory Visit (INDEPENDENT_AMBULATORY_CARE_PROVIDER_SITE_OTHER): Payer: 59

## 2020-07-17 DIAGNOSIS — J309 Allergic rhinitis, unspecified: Secondary | ICD-10-CM

## 2020-07-28 ENCOUNTER — Ambulatory Visit (INDEPENDENT_AMBULATORY_CARE_PROVIDER_SITE_OTHER): Payer: 59

## 2020-07-28 ENCOUNTER — Other Ambulatory Visit: Payer: Self-pay

## 2020-07-28 DIAGNOSIS — J4540 Moderate persistent asthma, uncomplicated: Secondary | ICD-10-CM

## 2020-08-12 ENCOUNTER — Ambulatory Visit: Payer: Self-pay

## 2020-11-03 ENCOUNTER — Other Ambulatory Visit: Payer: Self-pay

## 2020-11-03 ENCOUNTER — Ambulatory Visit (INDEPENDENT_AMBULATORY_CARE_PROVIDER_SITE_OTHER): Payer: PRIVATE HEALTH INSURANCE

## 2020-11-03 DIAGNOSIS — J4540 Moderate persistent asthma, uncomplicated: Secondary | ICD-10-CM

## 2020-11-05 NOTE — Progress Notes (Addendum)
VIALS NOT NEEDED YET. 

## 2020-11-11 ENCOUNTER — Telehealth: Payer: Self-pay | Admitting: *Deleted

## 2020-11-11 ENCOUNTER — Ambulatory Visit: Payer: Self-pay | Admitting: *Deleted

## 2020-11-11 NOTE — Telephone Encounter (Signed)
Patients last allergy injections were 07/17/20 at red 0.5 every 3 weeks. He wants to restart, where should he restart at? He has an appt with Lorene Dy on 4/12 and to restart Xolair.

## 2020-11-11 NOTE — Telephone Encounter (Signed)
After speaking with Dr. Dellis Anes he would like for him to be backed down to 0.025 in his red vial (1:100) and build per protcol.

## 2020-11-12 NOTE — Telephone Encounter (Signed)
Documented in allergy flow sheet. Thank you.

## 2020-11-16 NOTE — Patient Instructions (Addendum)
Moderate persistent asthma Start prednisone 10 mg twice a day for 3 days, then on the fourth day take 2 tablets in the morning on the fifth day take 1 tablet and stop.   For now start Dulera 200 mcg 2 puffs twice a day with spacer to help prevent cough and wheeze until we see you back Continue montelukast 10 mg once a day to prevent cough, wheeze, and shortness of breath ProAir 2 puffs every 4 hours as needed for cough, wheeze, and shortness of breath Continue Xolair 375 mg injections once every 2 weeks and have access to an epinephrine auto-injector set  Allergic rhinitis Stop Sudafed Continue Flonase nasal spray 2 sprays once a day to control stuffy nose. Try using this every day to help with nasal congestion  Continue Allegra(fexofenadine) 180 mg once a day as needed for a runny nose.  We will send in a prescription for this if your insurance covers this. You may use nasal saline rinses as needed for nasal symptoms. Use this before using medicated nasal sprays Re-start allergen immunotherapy 0.025 in his red vial (1:100) and build per protocol  Reflux Continue omeprazole 20 mg once a day as previously prescribed Continue dietary and lifestyle modifications as listed below  Continue the other medications as listed in your chart  Please let us know if this treatment plan is not working for you  Schedule a follow up in 6 weeks or sooner as needed .

## 2020-11-17 ENCOUNTER — Ambulatory Visit (INDEPENDENT_AMBULATORY_CARE_PROVIDER_SITE_OTHER): Payer: No Typology Code available for payment source

## 2020-11-17 ENCOUNTER — Other Ambulatory Visit: Payer: Self-pay

## 2020-11-17 ENCOUNTER — Encounter: Payer: Self-pay | Admitting: Family

## 2020-11-17 ENCOUNTER — Ambulatory Visit: Payer: PRIVATE HEALTH INSURANCE | Admitting: Family

## 2020-11-17 VITALS — BP 134/78 | HR 94 | Temp 98.3°F | Resp 20 | Ht 66.0 in | Wt 220.8 lb

## 2020-11-17 DIAGNOSIS — K219 Gastro-esophageal reflux disease without esophagitis: Secondary | ICD-10-CM

## 2020-11-17 DIAGNOSIS — J4540 Moderate persistent asthma, uncomplicated: Secondary | ICD-10-CM | POA: Diagnosis not present

## 2020-11-17 DIAGNOSIS — J4541 Moderate persistent asthma with (acute) exacerbation: Secondary | ICD-10-CM | POA: Diagnosis not present

## 2020-11-17 DIAGNOSIS — J3089 Other allergic rhinitis: Secondary | ICD-10-CM | POA: Diagnosis not present

## 2020-11-17 DIAGNOSIS — J302 Other seasonal allergic rhinitis: Secondary | ICD-10-CM

## 2020-11-17 MED ORDER — FEXOFENADINE HCL 180 MG PO TABS: ORAL_TABLET | ORAL | 5 refills | Status: DC

## 2020-11-17 MED ORDER — DULERA 200-5 MCG/ACT IN AERO: INHALATION_SPRAY | RESPIRATORY_TRACT | 3 refills | Status: DC

## 2020-11-17 MED ORDER — EPINEPHRINE 0.3 MG/0.3ML IJ SOAJ: INTRAMUSCULAR | 1 refills | Status: DC

## 2020-11-17 NOTE — Progress Notes (Signed)
[address] [place] [address] Dept: [phone]  FOLLOW UP NOTE  Patient ID: Clifford Williamson, male    DOB: 1971-05-14  Age: 50 y.o. MRN: [id number] Date of Office Visit: 11/17/2020  Assessment  Chief Complaint: Asthma  HPI Clifford Williamson is a 50 year old male who presents today for follow-up of moderate persistent asthma, seasonal and perennial allergic rhinitis, and gastroesophageal reflux disease.  He was last seen on April 01, 2020 by Thermon Leyland, FNP.  Moderate persistent asthma is reported as not well controlled with montelukast 10 mg once a day, Xolair 375 mg every 2 weeks, and albuterol as needed.  He has not used his [facility] for asthma flares.  He just recently started back with his Xolair injections after having to stop due to changes in insurance.  He has now received 2 Xolair injections.  He reports chronic dry cough, people telling him that he is wheezing, and very little shortness of breath.  He denies any tightness in his chest, nocturnal awakenings, and fever or chills.  Since his last office visit he has not required any systemic steroids or made any trips to the emergency room or urgent care due to breathing problems.  He has not used his albuterol in the past couple weeks.  Seasonal and perennial allergic rhinitis is reported as not well controlled with fluticasone nasal spray as needed.  He has not been taking Allegra because it is too expensive and he has been getting samples from our office.  He is interested in restarting allergy injections.  He reports that while he was on allergy injections they did help.  He reports nasal congestion and denies rhinorrhea and postnasal drip.  He has not had any sinus infections since we last saw him.  Reflux is reported as controlled with omeprazole 20 mg once a day.   Drug Allergies:  Allergies  Allergen Reactions  . Azithromycin Itching and Rash    Rash/ itching  . Nsaids Other (See Comments)    Other reaction(s):  ASTHMA Other reaction(s): ASTHMA Other reaction(s): ASTHMA  . Tolmetin Other (See Comments)    Other reaction(s): ASTHMA Other reaction(s): ASTHMA  . Aspirin Other (See Comments)    Other reaction(s): ASTHMA Triggers asthma  . Excedrin Extra Strength  [Aspirin-Acetaminophen-Caffeine]   . Ibuprofen   . Naproxen Sodium   . Other Other (See Comments)    Review of Systems: Review of Systems  Constitutional: Negative for chills and fever.  HENT:       Reports nasal congestion and denies rhinorrhea and postnasal drip  Eyes:       Denies itchy watery eyes  Respiratory: Positive for cough and wheezing. Negative for shortness of breath.        Reports dry cough and wheezing.  Denies tightness in his chest shortness of breath  Cardiovascular: Negative for chest pain and palpitations.  Gastrointestinal: Negative for heartburn.  Genitourinary: Negative for dysuria.  Skin: Positive for itching.       Reports itching in bilateral lower legs.  Started approximately 1 to 2 weeks ago  Neurological: Negative for headaches.  Endo/Heme/Allergies: Positive for environmental allergies.    Physical Exam: BP 134/78 (BP Location: Left Arm, Patient Position: Sitting, Cuff Size: Normal)   Pulse 94   Temp 98.3 F (36.8 C) (Temporal)   Resp 20   Ht 5\' 6"  (1.676 m)   Wt 220 lb 12.8 oz (100.2 kg)   SpO2 98%   BMI 35.64 kg/m    Physical  Exam Constitutional:      Appearance: Normal appearance.  HENT:     Head: Normocephalic and atraumatic.     Comments: Pharynx normal, eyes normal, ears: Unable to see bilateral tympanic membranes due to cerumen.  Nose bilateral lower turbinates severely edematous and slightly erythematous with clear drainage noted    Right Ear: Ear canal and external ear normal.     Left Ear: Ear canal and external ear normal.  Cardiovascular:     Rate and Rhythm: Regular rhythm.     Heart sounds: Normal heart sounds.  Pulmonary:     Effort: Pulmonary effort is normal.      Comments: Wheezing heard throughout all lung fields.  Lungs clear throughout after giving 4 puffs of albuterol Musculoskeletal:     Cervical back: Neck supple.  Skin:    General: Skin is warm.     Comments: No rash or hives noted on bilateral lower legs  Neurological:     Mental Status: He is alert and oriented to person, place, and time.  Psychiatric:        Mood and Affect: Mood normal.        Behavior: Behavior normal.        Thought Content: Thought content normal.        Judgment: Judgment normal.     Diagnostics: FVC 2.12 L, FEV1 1.42 L.  Predicted FVC 3.62 L, FEV1 2.89 L.  Spirometry indicates moderate restriction and mild airway obstruction.  Post bronchodilator response shows FVC 2.56 L, FEV1 1.84 L.  Spirometry indicates mild restriction with a 30% change in FEV1.  Assessment and Plan: 1. Moderate persistent asthma with (acute) exacerbation   2. Seasonal and perennial allergic rhinitis   3. Gastroesophageal reflux disease, unspecified whether esophagitis present     No orders of the defined types were placed in this encounter.   Patient Instructions  Moderate persistent asthma Start prednisone 10 mg twice a day for 3 days, then on the fourth day take 2 tablets in the morning on the fifth day take 1 tablet and stop.   For now start Dulera 200 mcg 2 puffs twice a day with spacer to help prevent cough and wheeze until we see you back Continue montelukast 10 mg once a day to prevent cough, wheeze, and shortness of breath ProAir 2 puffs every 4 hours as needed for cough, wheeze, and shortness of breath Continue Xolair 375 mg injections once every 2 weeks and have access to an epinephrine auto-injector set  Allergic rhinitis Stop Sudafed Continue Flonase nasal spray 2 sprays once a day to control stuffy nose. Try using this every day to help with nasal congestion  Continue Allegra(fexofenadine) 180 mg once a day as needed for a runny nose.  We will send in a prescription  for this if your insurance covers this. You may use nasal saline rinses as needed for nasal symptoms. Use this before using medicated nasal sprays Re-start allergen immunotherapy 0.025 in his red vial (1:100) and build per protocol  Reflux Continue omeprazole 20 mg once a day as previously prescribed Continue dietary and lifestyle modifications as listed below  Continue the other medications as listed in your chart  Please let us know if this treatment plan is not working for you  Schedule a follow up in 6 weeks or sooner as needed .    Return in about 6 weeks (around 12/29/2020), or if symptoms worsen or fail to improve.    Thank you for the  opportunity to care for this patient.  Please do not hesitate to contact me with questions.  Nehemiah Settle, FNP [facility]

## 2020-12-01 ENCOUNTER — Other Ambulatory Visit: Payer: Self-pay

## 2020-12-01 ENCOUNTER — Ambulatory Visit (INDEPENDENT_AMBULATORY_CARE_PROVIDER_SITE_OTHER): Payer: PRIVATE HEALTH INSURANCE

## 2020-12-01 DIAGNOSIS — J4540 Moderate persistent asthma, uncomplicated: Secondary | ICD-10-CM | POA: Diagnosis not present

## 2020-12-15 ENCOUNTER — Ambulatory Visit: Payer: Self-pay

## 2020-12-16 ENCOUNTER — Other Ambulatory Visit: Payer: Self-pay

## 2020-12-16 ENCOUNTER — Ambulatory Visit (INDEPENDENT_AMBULATORY_CARE_PROVIDER_SITE_OTHER): Payer: PRIVATE HEALTH INSURANCE

## 2020-12-16 DIAGNOSIS — J4540 Moderate persistent asthma, uncomplicated: Secondary | ICD-10-CM

## 2020-12-28 NOTE — Patient Instructions (Incomplete)
Moderate persistent asthma   Continue Dulera 200 mcg 2 puffs twice a day with spacer to help prevent cough and wheeze until we see you back Continue montelukast 10 mg once a day to prevent cough, wheeze, and shortness of breath ProAir 2 puffs every 4 hours as needed for cough, wheeze, and shortness of breath Continue Xolair 375 mg injections once every 2 weeks and have access to an epinephrine auto-injector set  Allergic rhinitis Continue Flonase nasal spray 2 sprays once a day to control stuffy nose. Try using this every day to help with nasal congestion  Continue Allegra(fexofenadine) 180 mg once a day as needed for a runny nose.  We will send in a prescription for this if your insurance covers this. You may use nasal saline rinses as needed for nasal symptoms. Use this before using medicated nasal sprays  Reflux Continue omeprazole 20 mg once a day as previously prescribed Continue dietary and lifestyle modifications as listed below   Please let us know if this treatment plan is not working for you  Schedule a follow up in months or sooner as needed .

## 2020-12-29 ENCOUNTER — Ambulatory Visit: Payer: PRIVATE HEALTH INSURANCE | Admitting: Family

## 2020-12-30 ENCOUNTER — Ambulatory Visit (INDEPENDENT_AMBULATORY_CARE_PROVIDER_SITE_OTHER): Payer: PRIVATE HEALTH INSURANCE

## 2020-12-30 ENCOUNTER — Other Ambulatory Visit: Payer: Self-pay

## 2020-12-30 DIAGNOSIS — J4540 Moderate persistent asthma, uncomplicated: Secondary | ICD-10-CM

## 2021-01-07 ENCOUNTER — Ambulatory Visit: Payer: PRIVATE HEALTH INSURANCE | Admitting: Family

## 2021-01-13 ENCOUNTER — Ambulatory Visit: Payer: Self-pay

## 2021-01-13 NOTE — Patient Instructions (Addendum)
  Moderate persistent asthma   Start Dulera 200 mcg 2 puffs twice a day with spacer to help prevent cough and wheeze until we see you back Continue montelukast 10 mg once a day to prevent cough, wheeze, and shortness of breath ProAir 2 puffs every 4 hours as needed for cough, wheeze, and shortness of breath Continue Xolair 375 mg injections once every 2 weeks and have access to an epinephrine auto-injector set  Allergic rhinitis Stop taking Sudafed and start using Flonase nasal spray in its place Continue Flonase nasal spray 2 sprays once a day to control stuffy nose. Try using this every day to help with nasal congestion. In the right nostril, point the applicator out toward the right ear. In the left nostril, point the applicator out toward the left ear Continue cetirizine 10 mg once a day as needed for a runny nose.   You may use nasal saline rinses as needed for nasal symptoms. Use this before using medicated nasal sprays  Reflux Continue omeprazole 20 mg once a day as previously prescribed Continue dietary and lifestyle modifications as listed below   Please let us know if this treatment plan is not working for you  Schedule a follow up in 2 months or sooner as needed .

## 2021-01-14 ENCOUNTER — Ambulatory Visit (INDEPENDENT_AMBULATORY_CARE_PROVIDER_SITE_OTHER): Payer: PRIVATE HEALTH INSURANCE | Admitting: Family

## 2021-01-14 ENCOUNTER — Other Ambulatory Visit: Payer: Self-pay

## 2021-01-14 ENCOUNTER — Encounter: Payer: Self-pay | Admitting: Family

## 2021-01-14 ENCOUNTER — Ambulatory Visit (INDEPENDENT_AMBULATORY_CARE_PROVIDER_SITE_OTHER): Payer: PRIVATE HEALTH INSURANCE

## 2021-01-14 VITALS — BP 132/80 | HR 98 | Temp 98.1°F | Ht 66.0 in | Wt 217.4 lb

## 2021-01-14 DIAGNOSIS — J4540 Moderate persistent asthma, uncomplicated: Secondary | ICD-10-CM

## 2021-01-14 DIAGNOSIS — K219 Gastro-esophageal reflux disease without esophagitis: Secondary | ICD-10-CM

## 2021-01-14 DIAGNOSIS — J3089 Other allergic rhinitis: Secondary | ICD-10-CM | POA: Diagnosis not present

## 2021-01-14 DIAGNOSIS — J302 Other seasonal allergic rhinitis: Secondary | ICD-10-CM

## 2021-01-14 MED ORDER — DULERA 200-5 MCG/ACT IN AERO: 2.0000 | INHALATION_SPRAY | Freq: Two times a day (BID) | RESPIRATORY_TRACT | 3 refills | Status: DC

## 2021-01-14 NOTE — Progress Notes (Signed)
[address] [place] [address] Dept: [phone]  FOLLOW UP NOTE  Patient ID: Clifford Williamson, male    DOB: [birth year]  Age: 50 y.o. MRN: [id number] Date of Office Visit: 01/14/2021  Assessment  Chief Complaint: Follow-up  HPI Clifford Williamson is a 50 year old male who presents today for follow-up of moderate persistent asthma with acute exacerbation, seasonal and perennial allergic rhinitis, and gastroesophageal reflux disease.  He was last seen on November 17, 2020 by Nehemiah Settle, FNP.  Moderate persistent asthma is reported as moderately controlled with montelukast 10 mg once a day, Xolair 375 mg injections every 2 weeks, and albuterol as needed.  He reports that he did not realize that he was supposed to start Dulera 200 mcg 2 puffs twice a day with a spacer.  He reports wheezing once at the gym and denies any coughing, tightness in his chest, shortness of breath, and nocturnal awakenings.  Since his last office visit he has not made any trips to the emergency room or urgent care due to breathing problems.  He uses his albuterol inhaler maybe 2 times a week.  Allergic rhinitis is reported as moderately controlled with Sudafed once a day, Flonase as needed, and Zyrtec 10 mg once a day.  He reports nasal congestion and denies rhinorrhea and postnasal drip.  He has not had any sinus infections since we last saw him.  He is wanting to call his insurance and think about allergy injections.  Reflux is reported as controlled with omeprazole 20 mg once a day.  He denies any symptoms of reflux or heartburn.   Drug Allergies:  Allergies  Allergen Reactions   Azithromycin Itching and Rash    Rash/ itching   Nsaids Other (See Comments)    Other reaction(s): ASTHMA Other reaction(s): ASTHMA Other reaction(s): ASTHMA   Tolmetin Other (See Comments)    Other reaction(s): ASTHMA Other reaction(s): ASTHMA   Aspirin Other (See Comments)    Other reaction(s): ASTHMA Triggers asthma    Excedrin Extra Strength  [Aspirin-Acetaminophen-Caffeine]    Ibuprofen    Naproxen Sodium    Other Other (See Comments)    Review of Systems: Review of Systems  Constitutional:  Negative for chills and fever.  HENT:         Reports nasal congestion and denies rhinorrhea and postnasal drip.  He has not had any sinus infections since we last saw him  Eyes:        Denies itchy watery eyes  Respiratory:  Positive for wheezing. Negative for cough and shortness of breath.        Reports wheezing once at the gym and denies shortness of breath and cough  Cardiovascular:  Negative for chest pain and palpitations.  Gastrointestinal:  Negative for heartburn.       Denies reflux  Genitourinary:  Negative for dysuria.  Skin:  Negative for itching and rash.  Neurological:  Negative for headaches.  Endo/Heme/Allergies:  Positive for environmental allergies.    Physical Exam: BP 132/80   Pulse 98   Temp 98.1 F (36.7 C)   Ht 5\' 6"  (1.676 m)   Wt 217 lb 6.4 oz (98.6 kg)   SpO2 99%   BMI 35.09 kg/m    Physical Exam Constitutional:      Appearance: Normal appearance.  HENT:     Head: Normocephalic and atraumatic.     Comments: Pharynx normal, eyes normal, ears: Unable to see bilateral tympanic membranes due to cerumen.  Nose: Bilateral lower  turbinates moderately edematous and slightly erythematous with no drainage noted    Right Ear: Ear canal and external ear normal.     Left Ear: Ear canal and external ear normal.     Mouth/Throat:     Mouth: Mucous membranes are moist.     Pharynx: Oropharynx is clear.  Eyes:     Conjunctiva/sclera: Conjunctivae normal.  Cardiovascular:     Rate and Rhythm: Regular rhythm.     Heart sounds: Normal heart sounds.  Pulmonary:     Effort: Pulmonary effort is normal.     Breath sounds: Normal breath sounds.     Comments: Lungs clear to auscultation Musculoskeletal:     Cervical back: Neck supple.  Skin:    General: Skin is warm.  Neurological:      Mental Status: He is alert and oriented to person, place, and time.  Psychiatric:        Mood and Affect: Mood normal.        Behavior: Behavior normal.        Thought Content: Thought content normal.        Judgment: Judgment normal.    Diagnostics: FVC 2.12 L, FEV1 1.72 L.  Predicted FVC 3.61 L, predicted FEV1 2.90 L.  Spirometry indicates possible moderately severe restriction.  Post bronchodilator response shows FVC 2.59 L, FEV1 1.85 L.  There is an 8% change in FEV1.  Spirometry indicates possible moderate restriction.  Assessment and Plan: 1. Moderate persistent asthma without complication   2. Seasonal and perennial allergic rhinitis   3. Gastroesophageal reflux disease, unspecified whether esophagitis present     Meds ordered this encounter  Medications   mometasone-formoterol (DULERA) 200-5 MCG/ACT AERO    Sig: Inhale 2 puffs into the lungs 2 (two) times daily.    Dispense:  1 each    Refill:  3     Patient Instructions   Moderate persistent asthma   Start Dulera 200 mcg 2 puffs twice a day with spacer to help prevent cough and wheeze until we see you back Continue montelukast 10 mg once a day to prevent cough, wheeze, and shortness of breath ProAir 2 puffs every 4 hours as needed for cough, wheeze, and shortness of breath Continue Xolair 375 mg injections once every 2 weeks and have access to an epinephrine auto-injector set  Allergic rhinitis Stop taking Sudafed and start using Flonase nasal spray in its place Continue Flonase nasal spray 2 sprays once a day to control stuffy nose. Try using this every day to help with nasal congestion. In the right nostril, point the applicator out toward the right ear. In the left nostril, point the applicator out toward the left ear Continue cetirizine 10 mg once a day as needed for a runny nose.   You may use nasal saline rinses as needed for nasal symptoms. Use this before using medicated nasal sprays  Reflux Continue  omeprazole 20 mg once a day as previously prescribed Continue dietary and lifestyle modifications as listed below   Please let us know if this treatment plan is not working for you  Schedule a follow up in 2 months or sooner as needed .  Return in about 2 months (around 03/16/2021), or if symptoms worsen or fail to improve.    Thank you for the opportunity to care for this patient.  Please do not hesitate to contact me with questions.  Nehemiah Settle, FNP [facility]

## 2021-01-27 ENCOUNTER — Ambulatory Visit: Payer: PRIVATE HEALTH INSURANCE

## 2021-01-28 ENCOUNTER — Ambulatory Visit (INDEPENDENT_AMBULATORY_CARE_PROVIDER_SITE_OTHER): Payer: PRIVATE HEALTH INSURANCE

## 2021-01-28 DIAGNOSIS — J4540 Moderate persistent asthma, uncomplicated: Secondary | ICD-10-CM | POA: Diagnosis not present

## 2021-02-11 ENCOUNTER — Ambulatory Visit (INDEPENDENT_AMBULATORY_CARE_PROVIDER_SITE_OTHER): Payer: PRIVATE HEALTH INSURANCE

## 2021-02-11 ENCOUNTER — Other Ambulatory Visit: Payer: Self-pay

## 2021-02-11 DIAGNOSIS — J4540 Moderate persistent asthma, uncomplicated: Secondary | ICD-10-CM | POA: Diagnosis not present

## 2021-02-25 ENCOUNTER — Ambulatory Visit: Payer: Self-pay

## 2021-03-02 ENCOUNTER — Ambulatory Visit: Payer: Self-pay

## 2021-03-03 ENCOUNTER — Ambulatory Visit: Payer: PRIVATE HEALTH INSURANCE

## 2021-03-04 ENCOUNTER — Other Ambulatory Visit: Payer: Self-pay

## 2021-03-04 ENCOUNTER — Ambulatory Visit (INDEPENDENT_AMBULATORY_CARE_PROVIDER_SITE_OTHER): Payer: PRIVATE HEALTH INSURANCE

## 2021-03-04 DIAGNOSIS — J4540 Moderate persistent asthma, uncomplicated: Secondary | ICD-10-CM

## 2021-03-17 ENCOUNTER — Other Ambulatory Visit: Payer: Self-pay

## 2021-03-17 ENCOUNTER — Ambulatory Visit: Payer: PRIVATE HEALTH INSURANCE | Admitting: Family Medicine

## 2021-03-17 ENCOUNTER — Ambulatory Visit (INDEPENDENT_AMBULATORY_CARE_PROVIDER_SITE_OTHER): Payer: PRIVATE HEALTH INSURANCE

## 2021-03-17 DIAGNOSIS — J4540 Moderate persistent asthma, uncomplicated: Secondary | ICD-10-CM | POA: Diagnosis not present

## 2021-03-17 NOTE — Progress Notes (Deleted)
   [address] [place] [address] Dept: [phone]  FOLLOW UP NOTE  Patient ID: Clifford Williamson, male    DOB: 15-Aug-1970  Age: 50 y.o. MRN: [id number] Date of Office Visit: 03/17/2021  Assessment  Chief Complaint: No chief complaint on file.  HPI Clifford Williamson    Drug Allergies:  Allergies  Allergen Reactions   Azithromycin Itching and Rash    Rash/ itching   Nsaids Other (See Comments)    Other reaction(s): ASTHMA Other reaction(s): ASTHMA Other reaction(s): ASTHMA   Tolmetin Other (See Comments)    Other reaction(s): ASTHMA Other reaction(s): ASTHMA   Aspirin Other (See Comments)    Other reaction(s): ASTHMA Triggers asthma   Excedrin Extra Strength  [Aspirin-Acetaminophen-Caffeine]    Ibuprofen    Naproxen Sodium    Other Other (See Comments)    Physical Exam: There were no vitals taken for this visit.   Physical Exam  Diagnostics:    Assessment and Plan: No diagnosis found.  No orders of the defined types were placed in this encounter.   There are no Patient Instructions on file for this visit.  No follow-ups on file.    Thank you for the opportunity to care for this patient.  Please do not hesitate to contact me with questions.  Thermon Leyland, FNP [facility]

## 2021-03-31 ENCOUNTER — Other Ambulatory Visit: Payer: Self-pay

## 2021-03-31 ENCOUNTER — Ambulatory Visit (INDEPENDENT_AMBULATORY_CARE_PROVIDER_SITE_OTHER): Payer: PRIVATE HEALTH INSURANCE

## 2021-03-31 DIAGNOSIS — J4540 Moderate persistent asthma, uncomplicated: Secondary | ICD-10-CM | POA: Diagnosis not present

## 2021-04-14 ENCOUNTER — Ambulatory Visit: Payer: PRIVATE HEALTH INSURANCE

## 2021-04-14 NOTE — Patient Instructions (Addendum)
Moderate persistent asthma   Begin Air Duo 232-1 puff twice a day to prevent cough or wheeze Continue montelukast 10 mg once a day to prevent cough, wheeze, and shortness of breath ProAir 2 puffs every 4 hours as needed for cough, wheeze, and shortness of breath Continue Xolair 375 mg injections once every 2 weeks and have access to an epinephrine auto-injector set  Allergic rhinitis Continue Flonase nasal spray 2 sprays once a day to control stuffy nose. Try using this every day to help with nasal congestion. In the right nostril, point the applicator out toward the right ear. In the left nostril, point the applicator out toward the left ear Continue cetirizine 10 mg once a day as needed for a runny nose.   You may use nasal saline rinses as needed for nasal symptoms. Use this before using medicated nasal sprays Continue allergen avoidance measures directed toward pollens, dust mite, mold, pet, and cockroach as listed below Resume allergen immunotherapy and have access to an epinephrine autoinjector set. Call your insurance company for clarification about billing  Reflux Continue omeprazole 20 mg once a day as previously prescribed Continue dietary and lifestyle modifications as listed below  Please let us know if this treatment plan is not working for you  Schedule a follow up in 2 months or sooner as needed  Reducing Pollen Exposure The American Academy of Allergy, Asthma and Immunology suggests the following steps to reduce your exposure to pollen during allergy seasons. Do not hang sheets or clothing out to dry; pollen may collect on these items. Do not mow lawns or spend time around freshly cut grass; mowing stirs up pollen. Keep windows closed at night.  Keep car windows closed while driving. Minimize morning activities outdoors, a time when pollen counts are usually at their highest. Stay indoors as much as possible when pollen counts or humidity is high and on windy days when  pollen tends to remain in the air longer. Use air conditioning when possible.  Many air conditioners have filters that trap the pollen spores. Use a HEPA room air filter to remove pollen form the indoor air you breathe.  Control of Mold Allergen Mold and fungi can grow on a variety of surfaces provided certain temperature and moisture conditions exist.  Outdoor molds grow on plants, decaying vegetation and soil.  The major outdoor mold, Alternaria and Cladosporium, are found in very high numbers during hot and dry conditions.  Generally, a late Summer - Fall peak is seen for common outdoor fungal spores.  Rain will temporarily lower outdoor mold spore count, but counts rise rapidly when the rainy period ends.  The most important indoor molds are Aspergillus and Penicillium.  Dark, humid and poorly ventilated basements are ideal sites for mold growth.  The next most common sites of mold growth are the bathroom and the kitchen.  Outdoor [employer] Use air conditioning and keep windows closed Avoid exposure to decaying vegetation. Avoid leaf raking. Avoid grain handling. Consider wearing a face mask if working in moldy areas.  Indoor Mold Control Maintain humidity below 50%. Clean washable surfaces with 5% bleach solution. Remove sources e.g. Contaminated carpets.   Control of Dust Mite Allergen Dust mites play a major role in allergic asthma and rhinitis. They occur in environments with high humidity wherever human skin is found. Dust mites absorb humidity from the atmosphere (ie, they do not drink) and feed on organic matter (including shed human and animal skin). Dust mites are a microscopic  type of insect that you cannot see with the naked eye. High levels of dust mites have been detected from mattresses, pillows, carpets, upholstered furniture, bed covers, clothes, soft toys and any woven material. The principal allergen of the dust mite is found in its feces. A gram of dust may contain  1,000 mites and 250,000 fecal particles. Mite antigen is easily measured in the air during house cleaning activities. Dust mites do not bite and do not cause harm to humans, other than by triggering allergies/asthma.  Ways to decrease your exposure to dust mites in your home:  1. Encase mattresses, box springs and pillows with a mite-impermeable barrier or cover  2. Wash sheets, blankets and drapes weekly in hot water (130 F) with detergent and dry them in a dryer on the hot setting.  3. Have the room cleaned frequently with a vacuum cleaner and a damp dust-mop. For carpeting or rugs, vacuuming with a vacuum cleaner equipped with a high-efficiency particulate air (HEPA) filter. The dust mite allergic individual should not be in a room which is being cleaned and should wait 1 hour after cleaning before going into the room.  4. Do not sleep on upholstered furniture (eg, couches).  5. If possible removing carpeting, upholstered furniture and drapery from the home is ideal. Horizontal blinds should be eliminated in the rooms where the person spends the most time (bedroom, study, television room). Washable vinyl, roller-type shades are optimal.  6. Remove all non-washable stuffed toys from the bedroom. Wash stuffed toys weekly like sheets and blankets above.  7. Reduce indoor humidity to less than 50%. Inexpensive humidity monitors can be purchased at most hardware stores. Do not use a humidifier as can make the problem worse and are not recommended.  Control of Cockroach Allergen Cockroach allergen has been identified as an important cause of acute attacks of asthma, especially in urban settings.  There are fifty-five species of cockroach that exist in the [place], however only three, the [place], [place] species produce allergen that can affect patients with Asthma.  Allergens can be obtained from fecal particles, egg casings and secretions from cockroaches.    Remove food  sources. Reduce access to water. Seal access and entry points. Spray runways with 0.5-1% Diazinon or Chlorpyrifos Blow boric acid power under stoves and refrigerator. Place bait stations (hydramethylnon) at feeding sites.

## 2021-04-14 NOTE — Progress Notes (Signed)
[address] [place] [address] Dept: [phone]  FOLLOW UP NOTE  Patient ID: Clifford Williamson, male    DOB: 29-Mar-1971  Age: 50 y.o. MRN: [id number] Date of Office Visit: 04/15/2021  Assessment  Chief Complaint: Allergic Rhinitis  and Asthma  HPI Clifford Williamson is a 50 year old male who presents the clinic for follow-up visit.  He was last seen in this clinic on 01/14/2021 by Nehemiah Settle, FNP, for evaluation of asthma, allergic rhinitis, and reflux.  In the interim, he reports he visited his primary care provider a few days ago and received a prednisone taper for nasal symptoms.  At today's visit, he reports his asthma has been well controlled with no shortness of breath, cough, or wheeze with activity or rest.  He continues montelukast 10 mg once a day and uses albuterol about once a month.  He reports that he is not currently using Dulera 200 as his insurance company would not pay for this.  He does continue Xolair 375 mg once every 2 weeks with no large or local reactions.  He reports a significant decrease in his symptoms of asthma while continuing on Xolair injections.  Allergic rhinitis is reported as moderately well controlled with nasal congestion as the main symptom.  He continues Flonase as needed and cetirizine as needed.  He demonstrates poor application technique with Flonase.  He does report daytime somnolence after taking cetirizine.  His last allergen immunotherapy was on 07/17/2020.  He reports that he would like to restart allergen immunotherapy, however, there is an issue with billing or insurance.  Reflux is reported as well controlled with no symptoms including heartburn or vomiting with omeprazole 20 mg once a day.  His current medications are listed in the chart.   Drug Allergies:  Allergies  Allergen Reactions   Azithromycin Itching and Rash    Rash/ itching   Nsaids Other (See Comments)    Other reaction(s): ASTHMA Other reaction(s): ASTHMA Other reaction(s):  ASTHMA   Tolmetin Other (See Comments)    Other reaction(s): ASTHMA Other reaction(s): ASTHMA   Aspirin Other (See Comments)    Other reaction(s): ASTHMA Triggers asthma   Excedrin Extra Strength  [Aspirin-Acetaminophen-Caffeine]    Ibuprofen    Naproxen Sodium    Other Other (See Comments)    Physical Exam: BP 124/76 (BP Location: Right Arm, Patient Position: Sitting, Cuff Size: Normal)   Pulse 84   Temp 97.9 F (36.6 C) (Temporal)   Resp 16   Ht 5\' 7"  (1.702 m)   Wt 215 lb 6.4 oz (97.7 kg)   SpO2 97%   BMI 33.74 kg/m    Physical Exam Vitals reviewed.  Constitutional:      Appearance: Normal appearance.  HENT:     Head: Normocephalic and atraumatic.     Right Ear: Tympanic membrane normal.     Left Ear: Tympanic membrane normal.     Nose:     Comments: Bilateral nares edematous and pale with clear nasal drainage noted.  Pharynx normal.  Ears normal.  Eyes normal.    Mouth/Throat:     Pharynx: Oropharynx is clear.  Eyes:     Conjunctiva/sclera: Conjunctivae normal.  Cardiovascular:     Rate and Rhythm: Normal rate and regular rhythm.     Heart sounds: Normal heart sounds. No murmur heard. Pulmonary:     Effort: Pulmonary effort is normal.     Breath sounds: Normal breath sounds.     Comments: Lungs clear to auscultation Musculoskeletal:  General: Normal range of motion.     Cervical back: Normal range of motion and neck supple.  Skin:    General: Skin is warm and dry.  Neurological:     Mental Status: He is alert and oriented to person, place, and time.  Psychiatric:        Mood and Affect: Mood normal.        Behavior: Behavior normal.        Thought Content: Thought content normal.        Judgment: Judgment normal.    Diagnostics: FVC 2.43, FEV1 1.71.  Predicted FVC 3.74, predicted FEV1 2.99.  Spirometry indicates possible restriction.  Postbronchodilator FVC 2.70, FEV1 1.98.  Postbronchodilator spirometry FEV1 improvement 16% and 370 mL in  addition to 11% improvement in FVC.  Assessment and Plan: 1. Moderate persistent asthma without complication   2. Seasonal and perennial allergic rhinitis   3. Gastroesophageal reflux disease, unspecified whether esophagitis present     Meds ordered this encounter  Medications   albuterol (PROAIR HFA) 108 (90 Base) MCG/ACT inhaler    Sig: Inhale 2 puffs into the lungs every 4 (four) hours as needed for wheezing or shortness of breath.    Dispense:  18 g    Refill:  1   montelukast (SINGULAIR) 10 MG tablet    Sig: Take 1 tablet once a day to prevent cough or wheeze    Dispense:  30 tablet    Refill:  5   fluticasone (FLONASE) 50 MCG/ACT nasal spray    Sig: Place 2 sprays into both nostrils daily.    Dispense:  16 g    Refill:  5   Fluticasone-Salmeterol,sensor, (AIRDUO DIGIHALER) 232-14 MCG/ACT AEPB    Sig: 1 puff twice a day to prevent cough or wheezing.    Dispense:  1 each    Refill:  2     Patient Instructions  Moderate persistent asthma   Begin Air Duo 232-1 puff twice a day to prevent cough or wheeze Continue montelukast 10 mg once a day to prevent cough, wheeze, and shortness of breath ProAir 2 puffs every 4 hours as needed for cough, wheeze, and shortness of breath Continue Xolair 375 mg injections once every 2 weeks and have access to an epinephrine auto-injector set  Allergic rhinitis Continue Flonase nasal spray 2 sprays once a day to control stuffy nose. Try using this every day to help with nasal congestion. In the right nostril, point the applicator out toward the right ear. In the left nostril, point the applicator out toward the left ear Continue cetirizine 10 mg once a day as needed for a runny nose.   You may use nasal saline rinses as needed for nasal symptoms. Use this before using medicated nasal sprays Continue allergen avoidance measures directed toward pollens, dust mite, mold, pet, and cockroach as listed below Resume allergen immunotherapy and have  access to an epinephrine autoinjector set. Call your insurance company for clarification about billing  Reflux Continue omeprazole 20 mg once a day as previously prescribed Continue dietary and lifestyle modifications as listed below  Please let us know if this treatment plan is not working for you  Schedule a follow up in 2 months or sooner as needed   Return in about 2 months (around 06/15/2021), or if symptoms worsen or fail to improve.    Thank you for the opportunity to care for this patient.  Please do not hesitate to contact me with questions.  Thurston Hole  Maury Bamba, FNP [facility]

## 2021-04-15 ENCOUNTER — Other Ambulatory Visit: Payer: Self-pay

## 2021-04-15 ENCOUNTER — Ambulatory Visit: Payer: PRIVATE HEALTH INSURANCE | Admitting: Family Medicine

## 2021-04-15 ENCOUNTER — Ambulatory Visit (INDEPENDENT_AMBULATORY_CARE_PROVIDER_SITE_OTHER): Payer: PRIVATE HEALTH INSURANCE

## 2021-04-15 ENCOUNTER — Encounter: Payer: Self-pay | Admitting: Family Medicine

## 2021-04-15 VITALS — BP 124/76 | HR 84 | Temp 97.9°F | Resp 16 | Ht 67.0 in | Wt 215.4 lb

## 2021-04-15 DIAGNOSIS — J3089 Other allergic rhinitis: Secondary | ICD-10-CM | POA: Diagnosis not present

## 2021-04-15 DIAGNOSIS — J4540 Moderate persistent asthma, uncomplicated: Secondary | ICD-10-CM

## 2021-04-15 DIAGNOSIS — J302 Other seasonal allergic rhinitis: Secondary | ICD-10-CM

## 2021-04-15 DIAGNOSIS — K219 Gastro-esophageal reflux disease without esophagitis: Secondary | ICD-10-CM

## 2021-04-15 MED ORDER — FLUTICASONE PROPIONATE 50 MCG/ACT NA SUSP: 2.0000 | Freq: Every day | NASAL | 5 refills | Status: DC

## 2021-04-15 MED ORDER — AIRDUO DIGIHALER 232-14 MCG/ACT IN AEPB: INHALATION_SPRAY | RESPIRATORY_TRACT | 2 refills | Status: DC

## 2021-04-15 MED ORDER — MONTELUKAST SODIUM 10 MG PO TABS: ORAL_TABLET | ORAL | 5 refills | Status: DC

## 2021-04-15 MED ORDER — ALBUTEROL SULFATE HFA 108 (90 BASE) MCG/ACT IN AERS: 2.0000 | INHALATION_SPRAY | RESPIRATORY_TRACT | 1 refills | Status: DC | PRN

## 2021-04-21 ENCOUNTER — Ambulatory Visit (INDEPENDENT_AMBULATORY_CARE_PROVIDER_SITE_OTHER): Payer: PRIVATE HEALTH INSURANCE

## 2021-04-21 ENCOUNTER — Ambulatory Visit (INDEPENDENT_AMBULATORY_CARE_PROVIDER_SITE_OTHER): Payer: PRIVATE HEALTH INSURANCE | Admitting: Podiatry

## 2021-04-21 ENCOUNTER — Other Ambulatory Visit: Payer: Self-pay | Admitting: Podiatry

## 2021-04-21 ENCOUNTER — Encounter: Payer: Self-pay | Admitting: Podiatry

## 2021-04-21 ENCOUNTER — Other Ambulatory Visit: Payer: Self-pay

## 2021-04-21 DIAGNOSIS — M79672 Pain in left foot: Secondary | ICD-10-CM

## 2021-04-21 DIAGNOSIS — M722 Plantar fascial fibromatosis: Secondary | ICD-10-CM

## 2021-04-21 DIAGNOSIS — M79671 Pain in right foot: Secondary | ICD-10-CM | POA: Diagnosis not present

## 2021-04-21 MED ORDER — TRIAMCINOLONE ACETONIDE 10 MG/ML IJ SUSP
20.0000 mg | Freq: Once | INTRAMUSCULAR | Status: AC
  Administered 2021-04-21: 20 mg

## 2021-04-21 NOTE — Patient Instructions (Signed)

## 2021-04-22 NOTE — Progress Notes (Signed)
Subjective:   Patient ID: Clifford Williamson, male   DOB: 50 y.o.   MRN: [id number]   HPI Patient has developed severe discomfort in the plantar aspect of both heels right over left.  Does not remember specific injury but states they both are bothering him and its been going on for the last few months and is worse when he gets up or after periods of sitting.  Patient does not smoke and likes to be active   Review of Systems  All other systems reviewed and are negative.      Objective:  Physical Exam Vitals and nursing note reviewed.  Constitutional:      Appearance: He is well-developed.  Pulmonary:     Effort: Pulmonary effort is normal.  Musculoskeletal:        General: Normal range of motion.  Skin:    General: Skin is warm.  Neurological:     Mental Status: He is alert.    Neurovascular status found to be intact muscle strength was found to be adequate range of motion within normal limits.  Patient is found to have exquisite discomfort in the right plantar fascia at the insertional point of the tendon to the calcaneus with inflammation and fluid around the medial band.  Patient is found to have good digital perfusion well oriented x3 with the left being sore also not to the same degree and moderate flatfoot deformity     Assessment:  Acute plantar fasciitis right over left with inflammation fluid buildup     Plan:  H&P reviewed condition x-rays reviewed education rendered.  Today I did sterile prep and injected the plantar fascial bilateral 3 mg Kenalog 5 mg Xylocaine and for the right I went ahead and I applied fascial brace to lift up the arch I discussed shoe gear modifications and I discussed physical therapy.  Reappoint to recheck  X-rays indicate small spur no indications of stress fracture bilateral heel with moderate depression of the arch

## 2021-04-28 ENCOUNTER — Ambulatory Visit: Payer: PRIVATE HEALTH INSURANCE

## 2021-04-29 ENCOUNTER — Ambulatory Visit: Payer: PRIVATE HEALTH INSURANCE | Admitting: Podiatry

## 2021-05-10 ENCOUNTER — Ambulatory Visit: Payer: PRIVATE HEALTH INSURANCE

## 2021-05-14 ENCOUNTER — Other Ambulatory Visit: Payer: Self-pay

## 2021-05-14 ENCOUNTER — Ambulatory Visit (INDEPENDENT_AMBULATORY_CARE_PROVIDER_SITE_OTHER): Payer: PRIVATE HEALTH INSURANCE | Admitting: *Deleted

## 2021-05-14 DIAGNOSIS — J4540 Moderate persistent asthma, uncomplicated: Secondary | ICD-10-CM | POA: Diagnosis not present

## 2021-05-20 ENCOUNTER — Other Ambulatory Visit: Payer: Self-pay

## 2021-05-20 ENCOUNTER — Ambulatory Visit (INDEPENDENT_AMBULATORY_CARE_PROVIDER_SITE_OTHER): Payer: PRIVATE HEALTH INSURANCE | Admitting: Podiatry

## 2021-05-20 ENCOUNTER — Encounter: Payer: Self-pay | Admitting: Podiatry

## 2021-05-20 DIAGNOSIS — M722 Plantar fascial fibromatosis: Secondary | ICD-10-CM | POA: Diagnosis not present

## 2021-05-20 MED ORDER — TRIAMCINOLONE ACETONIDE 10 MG/ML IJ SUSP
10.0000 mg | Freq: Once | INTRAMUSCULAR | Status: AC
  Administered 2021-05-20: 10 mg

## 2021-05-20 NOTE — Progress Notes (Signed)
Subjective:   Patient ID: Clifford Williamson, male   DOB: 50 y.o.   MRN: [id number]   HPI Patient presents stating doing pretty well but getting pain in her foot right over left of a moderate nature stating he is improved from previous but he has 1 area on the right that is been quite sore still   ROS      Objective:  Physical Exam  Neurovascular status intact muscle strength found to be adequate range of motion within normal limits.  Patient is noted to have 1 area of exquisite discomfort plantar aspect right heel at the insertional point tendon calcaneus inflammation fluid buildup     Assessment:  To plantar fasciitis right with inflammation fluid around the medial band     Plan:  Reviewed condition at this point I went ahead did sterile prep and did inject the fascia right 3 mg Kenalog 5 g liken at insertion and allowing him to return to normal activity and will be seen back if symptoms indicate

## 2021-06-03 ENCOUNTER — Ambulatory Visit: Payer: PRIVATE HEALTH INSURANCE

## 2021-06-15 NOTE — Progress Notes (Signed)
[address] [place] [address] Dept: [phone]  FOLLOW UP NOTE  Patient ID: Clifford Williamson, Clifford Williamson    DOB: 1971/07/07  Age: 50 y.o. MRN: [id number] Date of Office Visit: 06/16/2021  Assessment  Chief Complaint: Asthma  HPI Clifford Williamson is a 50 year old Clifford Williamson who presents the clinic for follow-up visit.  He was last seen in this clinic on 04/15/2021 for evaluation of asthma on Xolair, allergic rhinitis, and reflux.  At today's visit, he reports his asthma has been " so-so" with symptoms including intermittent wheeze occurring in the daytime and nighttime and occasional shortness of breath.  He continues montelukast 10 mg once a day albuterol and Air Duo 232 as needed with moderate relief of symptoms.  He reports that his insurance will not pay for a dual inhaler and he has tried Symbicort, [employer], and [employer].  He continues Xolair injections 375 mg once every 2 weeks with no large or local reactions.  He reports a significant decrease in his symptoms of asthma while continuing on Xolair injections.  Allergic rhinitis is reported as moderately well controlled with sneezing as the main symptom.  He continues cetirizine 10 mg once a day as needed and Flonase as needed.  He is not currently using a saline nasal rinse. His last environmental allergy testing was on 09/28/2016 and was positive to pollens, pets, mold, dust mite and cockroach.  Reflux is reported as well controlled with no symptoms including heart Clifford Williamson or vomiting he is not currently taking omeprazole or any other medication to control reflux.  His current medications are listed in the chart.   Drug Allergies:  Allergies  Allergen Reactions   Azithromycin Itching and Rash    Rash/ itching   Nsaids Other (See Comments)    Other reaction(s): ASTHMA Other reaction(s): ASTHMA Other reaction(s): ASTHMA   Tolmetin Other (See Comments)    Other reaction(s): ASTHMA Other reaction(s): ASTHMA   Aspirin Other (See Comments)    Other  reaction(s): ASTHMA Triggers asthma   Excedrin Extra Strength  [Aspirin-Acetaminophen-Caffeine]    Ibuprofen    Naproxen Sodium    Other Other (See Comments)    Physical Exam: BP 140/90   Pulse 93   Temp 98.2 F (36.8 C) (Temporal)   Resp (!) 178   SpO2 97%    Physical Exam Vitals reviewed.  Constitutional:      Appearance: Normal appearance.  HENT:     Head: Normocephalic and atraumatic.     Right Ear: Tympanic membrane normal.     Left Ear: Tympanic membrane normal.     Nose:     Comments: Clifford Williamson with clear nasal drainage noted.  Normal.  Ears normal.  Eyes normal    Mouth/Throat:     Pharynx: Oropharynx is clear.  Eyes:     Conjunctiva/sclera: Conjunctivae normal.  Cardiovascular:     Rate and Rhythm: Normal rate and regular rhythm.     Heart sounds: Normal heart sounds. No murmur heard. Pulmonary:     Effort: Pulmonary effort is normal.     Breath sounds: Normal breath sounds.     Comments: Lungs clear to auscultation Musculoskeletal:        General: Normal range of motion.     Cervical back: Normal range of motion and neck supple.  Skin:    General: Skin is warm and dry.  Neurological:     Mental Status: He is alert and oriented to person, place, and time.  Psychiatric:  Mood and Affect: Mood normal.        Behavior: Behavior normal.        Thought Content: Thought content normal.        Judgment: Judgment normal.    Diagnostics: FVC 2.09, FEV1 1.47.  Predicted FVC 3.74, predicted FEV1 2.98.  Spirometry indicates restriction.  This is consistent with previous spirometry readings.  Assessment and Plan: 1. Not well controlled moderate persistent asthma   2. Seasonal and perennial allergic rhinitis   3. Gastroesophageal reflux disease, unspecified whether esophagitis present     Meds ordered this encounter  Medications   Fluticasone-Salmeterol,sensor, (AIRDUO DIGIHALER) 232-14 MCG/ACT AEPB    Sig: 1 puff twice a day to prevent cough or wheezing.     Dispense:  1 each    Refill:  2    Patient Instructions  Moderate persistent asthma   Continue Air Duo 232-1 puff twice a day to prevent cough or wheeze. We have sent this to a different pharmacy. Please call the clinic if this medication is still too expensive Continue montelukast 10 mg once a day to prevent cough, wheeze, and shortness of breath ProAir 2 puffs every 4 hours as needed for cough, wheeze, and shortness of breath Continue Xolair 375 mg injections once every 2 weeks and have access to an epinephrine auto-injector set  Allergic rhinitis Continue Flonase nasal spray 2 sprays once a day to control stuffy nose. Try using this every day to help with nasal congestion. In the right nostril, point the applicator out toward the right ear. In the left nostril, point the applicator out toward the left ear Continue cetirizine 10 mg once a day as needed for a runny nose.   You may use nasal saline rinses as needed for nasal symptoms. Use this before using medicated nasal sprays Continue allergen avoidance measures directed toward pollens, dust mite, mold, pet, and cockroach as listed below Resume allergen immunotherapy and have access to an epinephrine autoinjector set. Call your insurance company for clarification about billing  Reflux Continue omeprazole 20 mg once a day as previously prescribed Continue dietary and lifestyle modifications as listed below  Please let us know if this treatment plan is not working for you  Schedule a follow up in 3 months or sooner as needed   Return in about 3 months (around 09/16/2021), or if symptoms worsen or fail to improve.    Thank you for the opportunity to care for this patient.  Please do not hesitate to contact me with questions.  Thermon Leyland, FNP [facility]

## 2021-06-15 NOTE — Patient Instructions (Addendum)
Moderate persistent asthma   Continue Air Duo 232-1 puff twice a day to prevent cough or wheeze. We have sent this to a different pharmacy. Please call the clinic if this medication is still too expensive Continue montelukast 10 mg once a day to prevent cough, wheeze, and shortness of breath ProAir 2 puffs every 4 hours as needed for cough, wheeze, and shortness of breath Continue Xolair 375 mg injections once every 2 weeks and have access to an epinephrine auto-injector set  Allergic rhinitis Continue Flonase nasal spray 2 sprays once a day to control stuffy nose. Try using this every day to help with nasal congestion. In the right nostril, point the applicator out toward the right ear. In the left nostril, point the applicator out toward the left ear Continue cetirizine 10 mg once a day as needed for a runny nose.   You may use nasal saline rinses as needed for nasal symptoms. Use this before using medicated nasal sprays Continue allergen avoidance measures directed toward pollens, dust mite, mold, pet, and cockroach as listed below Resume allergen immunotherapy and have access to an epinephrine autoinjector set. Call your insurance company for clarification about billing  Reflux Continue omeprazole 20 mg once a day as previously prescribed Continue dietary and lifestyle modifications as listed below  Please let us know if this treatment plan is not working for you  Schedule a follow up in 3 months or sooner as needed  Reducing Pollen Exposure The American Academy of Allergy, Asthma and Immunology suggests the following steps to reduce your exposure to pollen during allergy seasons. Do not hang sheets or clothing out to dry; pollen may collect on these items. Do not mow lawns or spend time around freshly cut grass; mowing stirs up pollen. Keep windows closed at night.  Keep car windows closed while driving. Minimize morning activities outdoors, a time when pollen counts are usually at  their highest. Stay indoors as much as possible when pollen counts or humidity is high and on windy days when pollen tends to remain in the air longer. Use air conditioning when possible.  Many air conditioners have filters that trap the pollen spores. Use a HEPA room air filter to remove pollen form the indoor air you breathe.  Control of Mold Allergen Mold and fungi can grow on a variety of surfaces provided certain temperature and moisture conditions exist.  Outdoor molds grow on plants, decaying vegetation and soil.  The major outdoor mold, Alternaria and Cladosporium, are found in very high numbers during hot and dry conditions.  Generally, a late Summer - Fall peak is seen for common outdoor fungal spores.  Rain will temporarily lower outdoor mold spore count, but counts rise rapidly when the rainy period ends.  The most important indoor molds are Aspergillus and Penicillium.  Dark, humid and poorly ventilated basements are ideal sites for mold growth.  The next most common sites of mold growth are the bathroom and the kitchen.  Outdoor [employer] Use air conditioning and keep windows closed Avoid exposure to decaying vegetation. Avoid leaf raking. Avoid grain handling. Consider wearing a face mask if working in moldy areas.  Indoor Mold Control Maintain humidity below 50%. Clean washable surfaces with 5% bleach solution. Remove sources e.g. Contaminated carpets.   Control of Dust Mite Allergen Dust mites play a major role in allergic asthma and rhinitis. They occur in environments with high humidity wherever human skin is found. Dust mites absorb humidity from the atmosphere (ie, they  do not drink) and feed on organic matter (including shed human and animal skin). Dust mites are a microscopic type of insect that you cannot see with the naked eye. High levels of dust mites have been detected from mattresses, pillows, carpets, upholstered furniture, bed covers, clothes, soft toys and any  woven material. The principal allergen of the dust mite is found in its feces. A gram of dust may contain 1,000 mites and 250,000 fecal particles. Mite antigen is easily measured in the air during house cleaning activities. Dust mites do not bite and do not cause harm to humans, other than by triggering allergies/asthma.  Ways to decrease your exposure to dust mites in your home:  1. Encase mattresses, box springs and pillows with a mite-impermeable barrier or cover  2. Wash sheets, blankets and drapes weekly in hot water (130 F) with detergent and dry them in a dryer on the hot setting.  3. Have the room cleaned frequently with a vacuum cleaner and a damp dust-mop. For carpeting or rugs, vacuuming with a vacuum cleaner equipped with a high-efficiency particulate air (HEPA) filter. The dust mite allergic individual should not be in a room which is being cleaned and should wait 1 hour after cleaning before going into the room.  4. Do not sleep on upholstered furniture (eg, couches).  5. If possible removing carpeting, upholstered furniture and drapery from the home is ideal. Horizontal blinds should be eliminated in the rooms where the person spends the most time (bedroom, study, television room). Washable vinyl, roller-type shades are optimal.  6. Remove all non-washable stuffed toys from the bedroom. Wash stuffed toys weekly like sheets and blankets above.  7. Reduce indoor humidity to less than 50%. Inexpensive humidity monitors can be purchased at most hardware stores. Do not use a humidifier as can make the problem worse and are not recommended.  Control of Cockroach Allergen Cockroach allergen has been identified as an important cause of acute attacks of asthma, especially in urban settings.  There are fifty-five species of cockroach that exist in the [place], however only three, the [place], [place] species produce allergen that can affect patients with Asthma.  Allergens  can be obtained from fecal particles, egg casings and secretions from cockroaches.    Remove food sources. Reduce access to water. Seal access and entry points. Spray runways with 0.5-1% Diazinon or Chlorpyrifos Blow boric acid power under stoves and refrigerator. Place bait stations (hydramethylnon) at feeding sites.

## 2021-06-16 ENCOUNTER — Ambulatory Visit: Payer: PRIVATE HEALTH INSURANCE | Admitting: Family Medicine

## 2021-06-16 ENCOUNTER — Other Ambulatory Visit: Payer: Self-pay

## 2021-06-16 ENCOUNTER — Encounter: Payer: Self-pay | Admitting: Family Medicine

## 2021-06-16 ENCOUNTER — Ambulatory Visit (INDEPENDENT_AMBULATORY_CARE_PROVIDER_SITE_OTHER): Payer: PRIVATE HEALTH INSURANCE

## 2021-06-16 VITALS — BP 140/90 | HR 93 | Temp 98.2°F | Resp 178

## 2021-06-16 DIAGNOSIS — J3089 Other allergic rhinitis: Secondary | ICD-10-CM

## 2021-06-16 DIAGNOSIS — K219 Gastro-esophageal reflux disease without esophagitis: Secondary | ICD-10-CM | POA: Diagnosis not present

## 2021-06-16 DIAGNOSIS — J302 Other seasonal allergic rhinitis: Secondary | ICD-10-CM

## 2021-06-16 DIAGNOSIS — J4540 Moderate persistent asthma, uncomplicated: Secondary | ICD-10-CM

## 2021-06-16 MED ORDER — AIRDUO DIGIHALER 232-14 MCG/ACT IN AEPB: INHALATION_SPRAY | RESPIRATORY_TRACT | 2 refills | Status: DC

## 2021-06-17 ENCOUNTER — Encounter: Payer: Self-pay | Admitting: Family Medicine

## 2021-06-30 ENCOUNTER — Ambulatory Visit (INDEPENDENT_AMBULATORY_CARE_PROVIDER_SITE_OTHER): Payer: PRIVATE HEALTH INSURANCE | Admitting: *Deleted

## 2021-06-30 DIAGNOSIS — J4540 Moderate persistent asthma, uncomplicated: Secondary | ICD-10-CM

## 2021-07-14 ENCOUNTER — Ambulatory Visit: Payer: PRIVATE HEALTH INSURANCE

## 2021-07-16 ENCOUNTER — Ambulatory Visit (INDEPENDENT_AMBULATORY_CARE_PROVIDER_SITE_OTHER): Payer: PRIVATE HEALTH INSURANCE

## 2021-07-16 ENCOUNTER — Other Ambulatory Visit: Payer: Self-pay

## 2021-07-16 DIAGNOSIS — J4540 Moderate persistent asthma, uncomplicated: Secondary | ICD-10-CM | POA: Diagnosis not present

## 2021-07-27 ENCOUNTER — Ambulatory Visit: Payer: PRIVATE HEALTH INSURANCE

## 2021-08-20 ENCOUNTER — Ambulatory Visit (INDEPENDENT_AMBULATORY_CARE_PROVIDER_SITE_OTHER): Payer: PRIVATE HEALTH INSURANCE

## 2021-08-20 ENCOUNTER — Other Ambulatory Visit: Payer: Self-pay

## 2021-08-20 DIAGNOSIS — J4540 Moderate persistent asthma, uncomplicated: Secondary | ICD-10-CM | POA: Diagnosis not present

## 2021-08-30 ENCOUNTER — Other Ambulatory Visit: Payer: Self-pay | Admitting: *Deleted

## 2021-08-30 MED ORDER — XOLAIR 150 MG ~~LOC~~ SOLR: 375.0000 mg | SUBCUTANEOUS | 11 refills | Status: DC

## 2021-09-02 ENCOUNTER — Other Ambulatory Visit: Payer: Self-pay | Admitting: *Deleted

## 2021-09-02 ENCOUNTER — Telehealth: Payer: Self-pay | Admitting: Family Medicine

## 2021-09-02 ENCOUNTER — Other Ambulatory Visit: Payer: Self-pay

## 2021-09-02 ENCOUNTER — Ambulatory Visit (INDEPENDENT_AMBULATORY_CARE_PROVIDER_SITE_OTHER): Payer: PRIVATE HEALTH INSURANCE

## 2021-09-02 DIAGNOSIS — J4540 Moderate persistent asthma, uncomplicated: Secondary | ICD-10-CM

## 2021-09-02 MED ORDER — OMALIZUMAB 150 MG/ML ~~LOC~~ SOSY: 375.0000 mg | PREFILLED_SYRINGE | SUBCUTANEOUS | 11 refills | Status: DC

## 2021-09-02 NOTE — Telephone Encounter (Signed)
[facility] pharmacy states prescription- for xolair was sent in as vials, last order was prefilled syringes confirming preferance ph# [phone] ref# [id number]

## 2021-09-02 NOTE — Telephone Encounter (Signed)
Sent in correct rx 

## 2021-09-03 ENCOUNTER — Ambulatory Visit: Payer: PRIVATE HEALTH INSURANCE

## 2021-09-16 ENCOUNTER — Ambulatory Visit (INDEPENDENT_AMBULATORY_CARE_PROVIDER_SITE_OTHER): Payer: PRIVATE HEALTH INSURANCE | Admitting: *Deleted

## 2021-09-16 DIAGNOSIS — J4540 Moderate persistent asthma, uncomplicated: Secondary | ICD-10-CM

## 2021-09-20 ENCOUNTER — Ambulatory Visit: Payer: PRIVATE HEALTH INSURANCE | Admitting: Family Medicine

## 2021-09-29 ENCOUNTER — Ambulatory Visit: Payer: PRIVATE HEALTH INSURANCE | Admitting: Family Medicine

## 2021-09-29 DIAGNOSIS — J309 Allergic rhinitis, unspecified: Secondary | ICD-10-CM

## 2021-09-29 NOTE — Progress Notes (Unsigned)
° °  [address] [place] [address] Dept: [phone]  FOLLOW UP NOTE  Patient ID: Natalio Salois, male    DOB: [birth year]  Age: 51 y.o. MRN: [id number] Date of Office Visit: 09/29/2021  Assessment  Chief Complaint: No chief complaint on file.  HPI Tonatiuh Mallon is a 51 year old male who presents to the clinic for follow-up visit.  He was last seen in this clinic on 06/16/2021 by Thermon Leyland, FNP, for evaluation of asthma, allergic rhinitis, and reflux.   Drug Allergies:  Allergies  Allergen Reactions   Azithromycin Itching and Rash    Rash/ itching   Nsaids Other (See Comments)    Other reaction(s): ASTHMA Other reaction(s): ASTHMA Other reaction(s): ASTHMA   Tolmetin Other (See Comments)    Other reaction(s): ASTHMA Other reaction(s): ASTHMA   Aspirin Other (See Comments)    Other reaction(s): ASTHMA Triggers asthma   Excedrin Extra Strength  [Aspirin-Acetaminophen-Caffeine]    Ibuprofen    Naproxen Sodium    Other Other (See Comments)    Physical Exam: There were no vitals taken for this visit.   Physical Exam  Diagnostics:    Assessment and Plan: No diagnosis found.  No orders of the defined types were placed in this encounter.   There are no Patient Instructions on file for this visit.  No follow-ups on file.    Thank you for the opportunity to care for this patient.  Please do not hesitate to contact me with questions.  Thermon Leyland, FNP [facility]

## 2021-09-29 NOTE — Patient Instructions (Incomplete)
Moderate persistent asthma   Continue Air Duo 232-1 puff twice a day to prevent cough or wheeze. We have sent this to a different pharmacy. Please call the clinic if this medication is still too expensive Continue montelukast 10 mg once a day to prevent cough, wheeze, and shortness of breath Continue albuterol 2 puffs every 4 hours as needed for cough, wheeze, and shortness of breath Continue albuterol 2 puffs 5 to 15 minutes before activity to decrease cough or wheeze Continue Xolair 375 mg injections once every 2 weeks and have access to an epinephrine auto-injector set  Allergic rhinitis Continue Flonase nasal spray 2 sprays once a day to control stuffy nose. Try using this every day to help with nasal congestion. In the right nostril, point the applicator out toward the right ear. In the left nostril, point the applicator out toward the left ear Continue cetirizine 10 mg once a day as needed for a runny nose.   You may use nasal saline rinses as needed for nasal symptoms. Use this before using medicated nasal sprays Continue allergen avoidance measures directed toward pollens, dust mite, mold, pet, and cockroach as listed below Resume allergen immunotherapy and have access to an epinephrine autoinjector set. Call your insurance company for clarification about billing  Reflux Continue omeprazole 20 mg once a day as previously prescribed Continue dietary and lifestyle modifications as listed below  Please let us know if this treatment plan is not working for you  Schedule a follow up in 3 months or sooner as needed  Reducing Pollen Exposure The American Academy of Allergy, Asthma and Immunology suggests the following steps to reduce your exposure to pollen during allergy seasons. Do not hang sheets or clothing out to dry; pollen may collect on these items. Do not mow lawns or spend time around freshly cut grass; mowing stirs up pollen. Keep windows closed at night.  Keep car windows  closed while driving. Minimize morning activities outdoors, a time when pollen counts are usually at their highest. Stay indoors as much as possible when pollen counts or humidity is high and on windy days when pollen tends to remain in the air longer. Use air conditioning when possible.  Many air conditioners have filters that trap the pollen spores. Use a HEPA room air filter to remove pollen form the indoor air you breathe.  Control of Mold Allergen Mold and fungi can grow on a variety of surfaces provided certain temperature and moisture conditions exist.  Outdoor molds grow on plants, decaying vegetation and soil.  The major outdoor mold, Alternaria and Cladosporium, are found in very high numbers during hot and dry conditions.  Generally, a late Summer - Fall peak is seen for common outdoor fungal spores.  Rain will temporarily lower outdoor mold spore count, but counts rise rapidly when the rainy period ends.  The most important indoor molds are Aspergillus and Penicillium.  Dark, humid and poorly ventilated basements are ideal sites for mold growth.  The next most common sites of mold growth are the bathroom and the kitchen.  Outdoor [employer] Use air conditioning and keep windows closed Avoid exposure to decaying vegetation. Avoid leaf raking. Avoid grain handling. Consider wearing a face mask if working in moldy areas.  Indoor Mold Control Maintain humidity below 50%. Clean washable surfaces with 5% bleach solution. Remove sources e.g. Contaminated carpets.   Control of Dust Mite Allergen Dust mites play a major role in allergic asthma and rhinitis. They occur in environments with  high humidity wherever human skin is found. Dust mites absorb humidity from the atmosphere (ie, they do not drink) and feed on organic matter (including shed human and animal skin). Dust mites are a microscopic type of insect that you cannot see with the naked eye. High levels of dust mites have been  detected from mattresses, pillows, carpets, upholstered furniture, bed covers, clothes, soft toys and any woven material. The principal allergen of the dust mite is found in its feces. A gram of dust may contain 1,000 mites and 250,000 fecal particles. Mite antigen is easily measured in the air during house cleaning activities. Dust mites do not bite and do not cause harm to humans, other than by triggering allergies/asthma.  Ways to decrease your exposure to dust mites in your home:  1. Encase mattresses, box springs and pillows with a mite-impermeable barrier or cover  2. Wash sheets, blankets and drapes weekly in hot water (130 F) with detergent and dry them in a dryer on the hot setting.  3. Have the room cleaned frequently with a vacuum cleaner and a damp dust-mop. For carpeting or rugs, vacuuming with a vacuum cleaner equipped with a high-efficiency particulate air (HEPA) filter. The dust mite allergic individual should not be in a room which is being cleaned and should wait 1 hour after cleaning before going into the room.  4. Do not sleep on upholstered furniture (eg, couches).  5. If possible removing carpeting, upholstered furniture and drapery from the home is ideal. Horizontal blinds should be eliminated in the rooms where the person spends the most time (bedroom, study, television room). Washable vinyl, roller-type shades are optimal.  6. Remove all non-washable stuffed toys from the bedroom. Wash stuffed toys weekly like sheets and blankets above.  7. Reduce indoor humidity to less than 50%. Inexpensive humidity monitors can be purchased at most hardware stores. Do not use a humidifier as can make the problem worse and are not recommended.  Control of Cockroach Allergen Cockroach allergen has been identified as an important cause of acute attacks of asthma, especially in urban settings.  There are fifty-five species of cockroach that exist in the [place], however only three,  the [place], [place] species produce allergen that can affect patients with Asthma.  Allergens can be obtained from fecal particles, egg casings and secretions from cockroaches.    Remove food sources. Reduce access to water. Seal access and entry points. Spray runways with 0.5-1% Diazinon or Chlorpyrifos Blow boric acid power under stoves and refrigerator. Place bait stations (hydramethylnon) at feeding sites.

## 2021-09-30 ENCOUNTER — Ambulatory Visit: Payer: PRIVATE HEALTH INSURANCE

## 2021-10-07 ENCOUNTER — Other Ambulatory Visit: Payer: Self-pay

## 2021-10-07 ENCOUNTER — Ambulatory Visit (INDEPENDENT_AMBULATORY_CARE_PROVIDER_SITE_OTHER): Payer: PRIVATE HEALTH INSURANCE

## 2021-10-07 DIAGNOSIS — J4540 Moderate persistent asthma, uncomplicated: Secondary | ICD-10-CM | POA: Diagnosis not present

## 2021-10-13 NOTE — Progress Notes (Signed)
? ?[address] ?[place] [id number] ?Dept: [phone] ? ?FOLLOW UP NOTE ? ?Patient ID: Clifford Williamson, male    DOB: [birth year]  Age: 51 y.o. MRN: [id number] ?Date of Office Visit: 10/14/2021 ? ?Assessment  ?Chief Complaint: Asthma (Doing welll) ? ?HPI ?Clifford Williamson is a 51 year old male who presents the clinic for follow-up visit.  He was last seen in this clinic on 06/16/2021 by Thermon Leyland, FNP, for evaluation of asthma, allergic rhinitis, and reflux.  At today's visit, he reports his asthma has been moderately well controlled with shortness of breath and wheeze occurring infrequently with vigorous activity.  He denies shortness of breath, cough, or wheeze with moderate activity or rest.  He continues montelukast 10 mg once a day and uses albuterol before activity, about 2 to 3 days a week.  He reports that he has not needed to use albuterol for rescue since his last visit to this clinic.  He is not currently using a daily maintenance inhaler.  He has previously used AirDuo 232.  Allergic rhinitis is reported as moderately well controlled with occasional clear rhinorrhea and nasal congestion for which he uses cetirizine as needed and Flonase as needed.  He is not currently using a nasal saline rinse. His last environmental allergy skin testing was on 09/28/2016 and was positive to pollen, dust mite, mold, pets, and cockroach.  He does report that he has a dog in the home that does not sleep with him. Reflux is reported as well controlled with no symptoms including heartburn or vomiting.  He continues omeprazole 20 mg once a day.  His current medications are listed in the chart.   ? ? ?Drug Allergies:  ?Allergies  ?Allergen Reactions  ? Azithromycin Itching and Rash  ?  Rash/ itching  ? Nsaids Other (See Comments)  ?  Other reaction(s): ASTHMA ?Other reaction(s): ASTHMA ?Other reaction(s): ASTHMA  ? Tolmetin Other (See Comments)  ?  Other reaction(s): ASTHMA ?Other reaction(s): ASTHMA  ? Aspirin Other (See  Comments)  ?  Other reaction(s): ASTHMA ?Triggers asthma  ? Excedrin Extra Strength  [Aspirin-Acetaminophen-Caffeine]   ? Ibuprofen   ? Naproxen Sodium   ? Other Other (See Comments)  ? ? ?Physical Exam: ?BP (!) 144/90 (BP Location: Right Arm, Patient Position: Sitting, Cuff Size: Normal)   Pulse (!) 106   Temp 98 ?F (36.7 ?C) (Temporal)   Resp 20   Ht 5\' 7"  (1.702 m)   Wt 214 lb 4.8 oz (97.2 kg)   SpO2 97%   BMI 33.56 kg/m?   ? ?Physical Exam ?Vitals reviewed.  ?Constitutional:   ?   Appearance: Normal appearance.  ?HENT:  ?   Head: Normocephalic and atraumatic.  ?   Right Ear: Tympanic membrane normal.  ?   Left Ear: Tympanic membrane normal.  ?   Nose:  ?   Comments: Bilateral nares edematous and pale with clear nasal drainage noted.  Pharynx normal.  Ears normal.  Eyes normal. ?   Mouth/Throat:  ?   Pharynx: Oropharynx is clear.  ?Eyes:  ?   Conjunctiva/sclera: Conjunctivae normal.  ?Cardiovascular:  ?   Rate and Rhythm: Normal rate and regular rhythm.  ?   Heart sounds: Normal heart sounds. No murmur heard. ?Pulmonary:  ?   Effort: Pulmonary effort is normal.  ?   Breath sounds: Normal breath sounds.  ?   Comments: Lungs clear to auscultation ?Musculoskeletal:     ?   General: Normal range of motion.  ?  Cervical back: Normal range of motion and neck supple.  ?Skin: ?   General: Skin is warm and dry.  ?Neurological:  ?   Mental Status: He is alert and oriented to person, place, and time.  ?Psychiatric:     ?   Mood and Affect: Mood normal.     ?   Behavior: Behavior normal.     ?   Thought Content: Thought content normal.     ?   Judgment: Judgment normal.  ? ? ?Diagnostics: ?FVC 2.41, FEV1 1.83.  Predicted FVC 3.73, predicted FEV1 2.98.  Spirometry indicates moderate restriction.  This is consistent with previous spirometry readings. ? ?Assessment and Plan: ?1. Moderate persistent asthma without complication   ?2. Seasonal and perennial allergic rhinitis   ?3. Gastroesophageal reflux disease,  unspecified whether esophagitis present   ? ? ?Meds ordered this encounter  ?Medications  ? EPINEPHrine (AUVI-Q) 0.3 mg/0.3 mL IJ SOAJ injection  ?  Sig: Use as directed for severe allergic reaction  ?  Dispense:  2 each  ?  Refill:  1  ? ? ?Patient Instructions  ?Moderate persistent asthma   ?Continue montelukast 10 mg once a day to prevent cough, wheeze, and shortness of breath ?Continue albuterol 2 puffs every 4 hours as needed for cough, wheeze, and shortness of breath ?Continue albuterol 2 puffs 5 to 15 minutes before activity to decrease cough or wheeze ?Continue Xolair 375 mg injections once every 2 weeks and have access to an epinephrine auto-injector set ?For asthma flare begin AirDuo 2 three 2-1 puff twice a day for 2 weeks and call the clinic ? ?Allergic rhinitis ?Continue Flonase nasal spray 2 sprays once a day to control stuffy nose. Try using this every day to help with nasal congestion. In the right nostril, point the applicator out toward the right ear. In the left nostril, point the applicator out toward the left ear ?Continue an antihistamine once a day as needed for a runny nose or itch.Remember to rotate to a different antihistamine about every 3 months. Some examples of over the counter antihistamines include Zyrtec (cetirizine), Xyzal (levocetirizine), Allegra (fexofenadine), and Claritin (loratidine).  ?You may use nasal saline rinses as needed for nasal symptoms. Use this before using medicated nasal sprays ?Continue allergen avoidance measures directed toward pollens, dust mite, mold, pet, and cockroach as listed below ? ?Reflux ?Continue omeprazole 20 mg once a day as previously prescribed ?Continue dietary and lifestyle modifications as listed below ? ?Please let us know if this treatment plan is not working for you ? ?Schedule a follow up in 6 months or sooner as needed ? ? ?Return in about 6 months (around 04/16/2022), or if symptoms worsen or fail to improve. ?  ? ?Thank you for the  opportunity to care for this patient.  Please do not hesitate to contact me with questions. ? ?Thermon Leyland, FNP ?[facility] ? ? ? ? ? ?

## 2021-10-14 ENCOUNTER — Ambulatory Visit (INDEPENDENT_AMBULATORY_CARE_PROVIDER_SITE_OTHER): Payer: PRIVATE HEALTH INSURANCE | Admitting: Family Medicine

## 2021-10-14 ENCOUNTER — Other Ambulatory Visit: Payer: Self-pay

## 2021-10-14 ENCOUNTER — Encounter: Payer: Self-pay | Admitting: Family Medicine

## 2021-10-14 VITALS — BP 144/90 | HR 106 | Temp 98.0°F | Resp 20 | Ht 67.0 in | Wt 214.3 lb

## 2021-10-14 DIAGNOSIS — J3089 Other allergic rhinitis: Secondary | ICD-10-CM

## 2021-10-14 DIAGNOSIS — J4540 Moderate persistent asthma, uncomplicated: Secondary | ICD-10-CM | POA: Diagnosis not present

## 2021-10-14 DIAGNOSIS — K219 Gastro-esophageal reflux disease without esophagitis: Secondary | ICD-10-CM | POA: Diagnosis not present

## 2021-10-14 DIAGNOSIS — J302 Other seasonal allergic rhinitis: Secondary | ICD-10-CM

## 2021-10-14 MED ORDER — EPINEPHRINE 0.3 MG/0.3ML IJ SOAJ: INTRAMUSCULAR | 1 refills | Status: AC

## 2021-10-14 NOTE — Patient Instructions (Addendum)
Moderate persistent asthma   ?Continue montelukast 10 mg once a day to prevent cough, wheeze, and shortness of breath ?Continue albuterol 2 puffs every 4 hours as needed for cough, wheeze, and shortness of breath ?Continue albuterol 2 puffs 5 to 15 minutes before activity to decrease cough or wheeze ?Continue Xolair 375 mg injections once every 2 weeks and have access to an epinephrine auto-injector set ?For asthma flare begin AirDuo 2 three 2-1 puff twice a day for 2 weeks and call the clinic ? ?Allergic rhinitis ?Continue Flonase nasal spray 2 sprays once a day to control stuffy nose. Try using this every day to help with nasal congestion. In the right nostril, point the applicator out toward the right ear. In the left nostril, point the applicator out toward the left ear ?Continue an antihistamine once a day as needed for a runny nose or itch.Remember to rotate to a different antihistamine about every 3 months. Some examples of over the counter antihistamines include Zyrtec (cetirizine), Xyzal (levocetirizine), Allegra (fexofenadine), and Claritin (loratidine).  ?You may use nasal saline rinses as needed for nasal symptoms. Use this before using medicated nasal sprays ?Continue allergen avoidance measures directed toward pollens, dust mite, mold, pet, and cockroach as listed below ? ?Reflux ?Continue omeprazole 20 mg once a day as previously prescribed ?Continue dietary and lifestyle modifications as listed below ? ?Please let us know if this treatment plan is not working for you ? ?Schedule a follow up in 6 months or sooner as needed ? ?Reducing Pollen Exposure ?The American Academy of Allergy, Asthma and Immunology suggests the following steps to reduce your exposure to pollen during allergy seasons. ?Do not hang sheets or clothing out to dry; pollen may collect on these items. ?Do not mow lawns or spend time around freshly cut grass; mowing stirs up pollen. ?Keep windows closed at night.  Keep car windows  closed while driving. ?Minimize morning activities outdoors, a time when pollen counts are usually at their highest. ?Stay indoors as much as possible when pollen counts or humidity is high and on windy days when pollen tends to remain in the air longer. ?Use air conditioning when possible.  Many air conditioners have filters that trap the pollen spores. ?Use a HEPA room air filter to remove pollen form the indoor air you breathe. ? ?Control of Mold Allergen ?Mold and fungi can grow on a variety of surfaces provided certain temperature and moisture conditions exist.  Outdoor molds grow on plants, decaying vegetation and soil.  The major outdoor mold, Alternaria and Cladosporium, are found in very high numbers during hot and dry conditions.  Generally, a late Summer - Fall peak is seen for common outdoor fungal spores.  Rain will temporarily lower outdoor mold spore count, but counts rise rapidly when the rainy period ends.  The most important indoor molds are Aspergillus and Penicillium.  Dark, humid and poorly ventilated basements are ideal sites for mold growth.  The next most common sites of mold growth are the bathroom and the kitchen. ? ?Outdoor [employer] ?Use air conditioning and keep windows closed ?Avoid exposure to decaying vegetation. ?Avoid leaf raking. ?Avoid grain handling. ?Consider wearing a face mask if working in moldy areas. ? ?Indoor Mold Control ?Maintain humidity below 50%. ?Clean washable surfaces with 5% bleach solution. ?Remove sources e.g. Contaminated carpets. ? ? ?Control of Dust Mite Allergen ?Dust mites play a major role in allergic asthma and rhinitis. They occur in environments with high humidity wherever human skin  is found. Dust mites absorb humidity from the atmosphere (ie, they do not drink) and feed on organic matter (including shed human and animal skin). Dust mites are a microscopic type of insect that you cannot see with the naked eye. High levels of dust mites have been  detected from mattresses, pillows, carpets, upholstered furniture, bed covers, clothes, soft toys and any woven material. The principal allergen of the dust mite is found in its feces. A gram of dust may contain 1,000 mites and 250,000 fecal particles. Mite antigen is easily measured in the air during house cleaning activities. Dust mites do not bite and do not cause harm to humans, other than by triggering allergies/asthma. ? ?Ways to decrease your exposure to dust mites in your home: ? ?1. Encase mattresses, box springs and pillows with a mite-impermeable barrier or cover ? ?2. Wash sheets, blankets and drapes weekly in hot water (130? F) with detergent and dry them in a dryer on the hot setting. ? ?3. Have the room cleaned frequently with a vacuum cleaner and a damp dust-mop. For carpeting or rugs, vacuuming with a vacuum cleaner equipped with a high-efficiency particulate air (HEPA) filter. The dust mite allergic individual should not be in a room which is being cleaned and should wait 1 hour after cleaning before going into the room. ? ?4. Do not sleep on upholstered furniture (eg, couches). ? ?5. If possible removing carpeting, upholstered furniture and drapery from the home is ideal. Horizontal blinds should be eliminated in the rooms where the person spends the most time (bedroom, study, television room). Washable vinyl, roller-type shades are optimal. ? ?6. Remove all non-washable stuffed toys from the bedroom. Wash stuffed toys weekly like sheets and blankets above. ? ?7. Reduce indoor humidity to less than 50%. Inexpensive humidity monitors can be purchased at most hardware stores. Do not use a humidifier as can make the problem worse and are not recommended. ? ?Control of Cockroach Allergen ?Cockroach allergen has been identified as an important cause of acute attacks of asthma, especially in urban settings.  There are fifty-five species of cockroach that exist in the [place], however only three,  the [place], [place] species produce allergen that can affect patients with Asthma.  Allergens can be obtained from fecal particles, egg casings and secretions from cockroaches. ?   ?Remove food sources. ?Reduce access to water. ?Seal access and entry points. ?Spray runways with 0.5-1% Diazinon or Chlorpyrifos ?Blow boric acid power under stoves and refrigerator. ?Place bait stations (hydramethylnon) at feeding sites. ? ? ? ?

## 2021-10-21 ENCOUNTER — Other Ambulatory Visit: Payer: Self-pay

## 2021-10-21 ENCOUNTER — Ambulatory Visit (INDEPENDENT_AMBULATORY_CARE_PROVIDER_SITE_OTHER): Payer: PRIVATE HEALTH INSURANCE

## 2021-10-21 DIAGNOSIS — J4540 Moderate persistent asthma, uncomplicated: Secondary | ICD-10-CM

## 2021-11-03 ENCOUNTER — Ambulatory Visit (INDEPENDENT_AMBULATORY_CARE_PROVIDER_SITE_OTHER): Payer: PRIVATE HEALTH INSURANCE

## 2021-11-03 DIAGNOSIS — J4540 Moderate persistent asthma, uncomplicated: Secondary | ICD-10-CM | POA: Diagnosis not present

## 2021-11-04 ENCOUNTER — Ambulatory Visit: Payer: PRIVATE HEALTH INSURANCE

## 2021-11-17 ENCOUNTER — Ambulatory Visit (INDEPENDENT_AMBULATORY_CARE_PROVIDER_SITE_OTHER): Payer: PRIVATE HEALTH INSURANCE

## 2021-11-17 DIAGNOSIS — J4540 Moderate persistent asthma, uncomplicated: Secondary | ICD-10-CM

## 2021-11-18 ENCOUNTER — Ambulatory Visit: Payer: PRIVATE HEALTH INSURANCE

## 2021-12-01 ENCOUNTER — Ambulatory Visit: Payer: PRIVATE HEALTH INSURANCE

## 2021-12-03 ENCOUNTER — Ambulatory Visit (INDEPENDENT_AMBULATORY_CARE_PROVIDER_SITE_OTHER): Payer: PRIVATE HEALTH INSURANCE

## 2021-12-03 DIAGNOSIS — J4540 Moderate persistent asthma, uncomplicated: Secondary | ICD-10-CM | POA: Diagnosis not present

## 2021-12-17 ENCOUNTER — Ambulatory Visit (INDEPENDENT_AMBULATORY_CARE_PROVIDER_SITE_OTHER): Payer: PRIVATE HEALTH INSURANCE

## 2021-12-17 DIAGNOSIS — J4540 Moderate persistent asthma, uncomplicated: Secondary | ICD-10-CM

## 2021-12-31 ENCOUNTER — Ambulatory Visit: Payer: PRIVATE HEALTH INSURANCE

## 2022-01-11 ENCOUNTER — Ambulatory Visit (INDEPENDENT_AMBULATORY_CARE_PROVIDER_SITE_OTHER): Payer: PRIVATE HEALTH INSURANCE

## 2022-01-11 DIAGNOSIS — J4540 Moderate persistent asthma, uncomplicated: Secondary | ICD-10-CM

## 2022-01-25 ENCOUNTER — Ambulatory Visit: Payer: PRIVATE HEALTH INSURANCE

## 2022-02-17 ENCOUNTER — Ambulatory Visit (INDEPENDENT_AMBULATORY_CARE_PROVIDER_SITE_OTHER): Payer: PRIVATE HEALTH INSURANCE

## 2022-02-17 DIAGNOSIS — J4540 Moderate persistent asthma, uncomplicated: Secondary | ICD-10-CM | POA: Diagnosis not present

## 2022-02-18 ENCOUNTER — Ambulatory Visit: Payer: PRIVATE HEALTH INSURANCE

## 2022-03-01 ENCOUNTER — Ambulatory Visit: Payer: PRIVATE HEALTH INSURANCE

## 2022-03-04 ENCOUNTER — Ambulatory Visit: Payer: PRIVATE HEALTH INSURANCE

## 2022-03-22 ENCOUNTER — Ambulatory Visit (INDEPENDENT_AMBULATORY_CARE_PROVIDER_SITE_OTHER): Payer: No Typology Code available for payment source

## 2022-03-22 DIAGNOSIS — J4540 Moderate persistent asthma, uncomplicated: Secondary | ICD-10-CM | POA: Diagnosis not present

## 2022-03-24 DIAGNOSIS — R051 Acute cough: Secondary | ICD-10-CM | POA: Diagnosis not present

## 2022-03-24 DIAGNOSIS — U071 COVID-19: Secondary | ICD-10-CM | POA: Diagnosis not present

## 2022-03-24 DIAGNOSIS — M791 Myalgia, unspecified site: Secondary | ICD-10-CM | POA: Diagnosis not present

## 2022-03-24 DIAGNOSIS — Z20822 Contact with and (suspected) exposure to covid-19: Secondary | ICD-10-CM | POA: Diagnosis not present

## 2022-04-07 ENCOUNTER — Ambulatory Visit: Payer: PRIVATE HEALTH INSURANCE

## 2022-04-22 ENCOUNTER — Ambulatory Visit: Payer: No Typology Code available for payment source

## 2022-04-26 DIAGNOSIS — J309 Allergic rhinitis, unspecified: Secondary | ICD-10-CM | POA: Diagnosis not present

## 2022-04-26 DIAGNOSIS — M25552 Pain in left hip: Secondary | ICD-10-CM | POA: Diagnosis not present

## 2022-04-26 DIAGNOSIS — J00 Acute nasopharyngitis [common cold]: Secondary | ICD-10-CM | POA: Diagnosis not present

## 2022-04-26 DIAGNOSIS — I878 Other specified disorders of veins: Secondary | ICD-10-CM | POA: Diagnosis not present

## 2022-04-26 DIAGNOSIS — R350 Frequency of micturition: Secondary | ICD-10-CM | POA: Diagnosis not present

## 2022-05-02 ENCOUNTER — Ambulatory Visit (INDEPENDENT_AMBULATORY_CARE_PROVIDER_SITE_OTHER): Payer: No Typology Code available for payment source

## 2022-05-02 DIAGNOSIS — J4540 Moderate persistent asthma, uncomplicated: Secondary | ICD-10-CM | POA: Diagnosis not present

## 2022-05-03 ENCOUNTER — Ambulatory Visit: Payer: No Typology Code available for payment source

## 2022-05-13 DIAGNOSIS — M7062 Trochanteric bursitis, left hip: Secondary | ICD-10-CM | POA: Diagnosis not present

## 2022-05-16 ENCOUNTER — Ambulatory Visit (INDEPENDENT_AMBULATORY_CARE_PROVIDER_SITE_OTHER): Payer: No Typology Code available for payment source

## 2022-05-16 ENCOUNTER — Ambulatory Visit: Payer: No Typology Code available for payment source

## 2022-05-16 DIAGNOSIS — J4540 Moderate persistent asthma, uncomplicated: Secondary | ICD-10-CM | POA: Diagnosis not present

## 2022-06-01 ENCOUNTER — Ambulatory Visit (INDEPENDENT_AMBULATORY_CARE_PROVIDER_SITE_OTHER): Payer: No Typology Code available for payment source

## 2022-06-01 DIAGNOSIS — J4540 Moderate persistent asthma, uncomplicated: Secondary | ICD-10-CM

## 2022-06-16 ENCOUNTER — Ambulatory Visit: Payer: No Typology Code available for payment source

## 2022-06-17 DIAGNOSIS — Z23 Encounter for immunization: Secondary | ICD-10-CM | POA: Diagnosis not present

## 2022-06-27 ENCOUNTER — Ambulatory Visit: Payer: No Typology Code available for payment source

## 2022-08-10 DIAGNOSIS — M7062 Trochanteric bursitis, left hip: Secondary | ICD-10-CM | POA: Diagnosis not present

## 2022-08-24 ENCOUNTER — Other Ambulatory Visit: Payer: Self-pay | Admitting: Family Medicine

## 2022-09-08 DIAGNOSIS — M25472 Effusion, left ankle: Secondary | ICD-10-CM | POA: Diagnosis not present

## 2022-09-08 DIAGNOSIS — M25572 Pain in left ankle and joints of left foot: Secondary | ICD-10-CM | POA: Diagnosis not present

## 2022-09-08 DIAGNOSIS — R7303 Prediabetes: Secondary | ICD-10-CM | POA: Diagnosis not present

## 2022-09-08 DIAGNOSIS — E669 Obesity, unspecified: Secondary | ICD-10-CM | POA: Diagnosis not present

## 2022-11-01 DIAGNOSIS — R0602 Shortness of breath: Secondary | ICD-10-CM | POA: Diagnosis not present

## 2022-11-01 DIAGNOSIS — J45909 Unspecified asthma, uncomplicated: Secondary | ICD-10-CM | POA: Diagnosis not present

## 2022-11-01 DIAGNOSIS — Z8249 Family history of ischemic heart disease and other diseases of the circulatory system: Secondary | ICD-10-CM | POA: Diagnosis not present

## 2022-11-01 DIAGNOSIS — R079 Chest pain, unspecified: Secondary | ICD-10-CM | POA: Diagnosis not present

## 2022-11-01 DIAGNOSIS — G4733 Obstructive sleep apnea (adult) (pediatric): Secondary | ICD-10-CM | POA: Diagnosis not present

## 2022-11-01 DIAGNOSIS — Z886 Allergy status to analgesic agent status: Secondary | ICD-10-CM | POA: Diagnosis not present

## 2022-11-01 DIAGNOSIS — R051 Acute cough: Secondary | ICD-10-CM | POA: Diagnosis not present

## 2022-11-01 DIAGNOSIS — Z881 Allergy status to other antibiotic agents status: Secondary | ICD-10-CM | POA: Diagnosis not present

## 2022-11-01 DIAGNOSIS — J45901 Unspecified asthma with (acute) exacerbation: Secondary | ICD-10-CM | POA: Diagnosis not present

## 2022-11-01 DIAGNOSIS — Z20822 Contact with and (suspected) exposure to covid-19: Secondary | ICD-10-CM | POA: Diagnosis not present

## 2022-11-02 DIAGNOSIS — J45901 Unspecified asthma with (acute) exacerbation: Secondary | ICD-10-CM | POA: Diagnosis not present

## 2022-11-02 DIAGNOSIS — Z20822 Contact with and (suspected) exposure to covid-19: Secondary | ICD-10-CM | POA: Diagnosis not present

## 2022-11-02 DIAGNOSIS — R000 Tachycardia, unspecified: Secondary | ICD-10-CM | POA: Diagnosis not present

## 2022-11-03 ENCOUNTER — Ambulatory Visit: Payer: No Typology Code available for payment source | Admitting: Family Medicine

## 2022-11-03 ENCOUNTER — Ambulatory Visit (INDEPENDENT_AMBULATORY_CARE_PROVIDER_SITE_OTHER): Payer: No Typology Code available for payment source | Admitting: Internal Medicine

## 2022-11-03 ENCOUNTER — Encounter: Payer: Self-pay | Admitting: Internal Medicine

## 2022-11-03 VITALS — BP 158/80 | HR 96 | Temp 98.0°F | Resp 20

## 2022-11-03 DIAGNOSIS — J302 Other seasonal allergic rhinitis: Secondary | ICD-10-CM | POA: Diagnosis not present

## 2022-11-03 DIAGNOSIS — J3089 Other allergic rhinitis: Secondary | ICD-10-CM

## 2022-11-03 DIAGNOSIS — J4541 Moderate persistent asthma with (acute) exacerbation: Secondary | ICD-10-CM

## 2022-11-03 DIAGNOSIS — G4733 Obstructive sleep apnea (adult) (pediatric): Secondary | ICD-10-CM | POA: Diagnosis not present

## 2022-11-03 DIAGNOSIS — K219 Gastro-esophageal reflux disease without esophagitis: Secondary | ICD-10-CM | POA: Diagnosis not present

## 2022-11-03 DIAGNOSIS — J309 Allergic rhinitis, unspecified: Secondary | ICD-10-CM

## 2022-11-03 MED ORDER — METHYLPREDNISOLONE ACETATE 40 MG/ML IJ SUSP
40.0000 mg | Freq: Once | INTRAMUSCULAR | Status: AC
  Administered 2022-11-03: 40 mg via INTRAMUSCULAR

## 2022-11-03 MED ORDER — AZELASTINE HCL 0.1 % NA SOLN: 2.0000 | Freq: Two times a day (BID) | NASAL | 5 refills | Status: AC | PRN

## 2022-11-03 MED ORDER — BREZTRI AEROSPHERE 160-9-4.8 MCG/ACT IN AERO: 2.0000 | INHALATION_SPRAY | Freq: Two times a day (BID) | RESPIRATORY_TRACT | 5 refills | Status: DC

## 2022-11-03 NOTE — Patient Instructions (Addendum)
Moderate persistent asthma with exacerbation 40 mg IM depomedrol today in clinic Continue prednisone 40 mg for 4 days then 20 mg on day 5 then stop. For the next 2-3 days, continue albuterol 1 vial via nebulizer OR 6 puffs via inhaler every 4 hours while awake to help keep lungs open Start Breztri 2 puffs twice daily with a spacer. Rinse mouth out after use. USE EVERYDAY. Continue montelukast 10 mg once a day to prevent cough, wheeze, and shortness of breath Continue albuterol 2 puffs every 4 hours as needed for cough, wheeze, and shortness of breath Continue albuterol 2 puffs 5 to 15 minutes before activity to decrease cough or wheeze Restart Xolair 375 mg injections once every 2 weeks and have access to an epinephrine auto-injector set-will send message to Tammy  Allergic rhinitis Continue Flonase nasal spray 2 sprays once a day to control stuffy nose. Try using this every day to help with nasal congestion. In the right nostril, point the applicator out toward the right ear. In the left nostril, point the applicator out toward the left ear Continue an antihistamine once a day for a runny nose or itch.Remember to rotate to a different antihistamine about every 3 months. Some examples of over the counter antihistamines include Zyrtec (cetirizine), Xyzal (levocetirizine), Allegra (fexofenadine), and Claritin (loratidine).  You may use nasal saline rinses as needed for nasal symptoms. Use this before using medicated nasal sprays Start Azelastine nasal spray 1-2 sprays up to 2 times daily for itchy nose/itchy ears/nasal congestion/sneezing.  Continue allergen avoidance measures directed toward pollens, dust mite, mold, pet, and cockroach as listed below  Reflux Continue omeprazole 20 mg once a day as previously prescribed Continue dietary and lifestyle modifications as listed below  Please let us know if this treatment plan is not working for you  Schedule a follow up in 3 months or sooner as  needed  Reducing Pollen Exposure The American Academy of Allergy, Asthma and Immunology suggests the following steps to reduce your exposure to pollen during allergy seasons. Do not hang sheets or clothing out to dry; pollen may collect on these items. Do not mow lawns or spend time around freshly cut grass; mowing stirs up pollen. Keep windows closed at night.  Keep car windows closed while driving. Minimize morning activities outdoors, a time when pollen counts are usually at their highest. Stay indoors as much as possible when pollen counts or humidity is high and on windy days when pollen tends to remain in the air longer. Use air conditioning when possible.  Many air conditioners have filters that trap the pollen spores. Use a HEPA room air filter to remove pollen form the indoor air you breathe.  Control of Mold Allergen Mold and fungi can grow on a variety of surfaces provided certain temperature and moisture conditions exist.  Outdoor molds grow on plants, decaying vegetation and soil.  The major outdoor mold, Alternaria and Cladosporium, are found in very high numbers during hot and dry conditions.  Generally, a late Summer - Fall peak is seen for common outdoor fungal spores.  Rain will temporarily lower outdoor mold spore count, but counts rise rapidly when the rainy period ends.  The most important indoor molds are Aspergillus and Penicillium.  Dark, humid and poorly ventilated basements are ideal sites for mold growth.  The next most common sites of mold growth are the bathroom and the kitchen.  Outdoor [employer] Use air conditioning and keep windows closed Avoid exposure to decaying vegetation.  Avoid leaf raking. Avoid grain handling. Consider wearing a face mask if working in moldy areas.  Indoor Mold Control Maintain humidity below 50%. Clean washable surfaces with 5% bleach solution. Remove sources e.g. Contaminated carpets.   Control of Dust Mite Allergen Dust mites  play a major role in allergic asthma and rhinitis. They occur in environments with high humidity wherever human skin is found. Dust mites absorb humidity from the atmosphere (ie, they do not drink) and feed on organic matter (including shed human and animal skin). Dust mites are a microscopic type of insect that you cannot see with the naked eye. High levels of dust mites have been detected from mattresses, pillows, carpets, upholstered furniture, bed covers, clothes, soft toys and any woven material. The principal allergen of the dust mite is found in its feces. A gram of dust may contain 1,000 mites and 250,000 fecal particles. Mite antigen is easily measured in the air during house cleaning activities. Dust mites do not bite and do not cause harm to humans, other than by triggering allergies/asthma.  Ways to decrease your exposure to dust mites in your home:  1. Encase mattresses, box springs and pillows with a mite-impermeable barrier or cover  2. Wash sheets, blankets and drapes weekly in hot water (130 F) with detergent and dry them in a dryer on the hot setting.  3. Have the room cleaned frequently with a vacuum cleaner and a damp dust-mop. For carpeting or rugs, vacuuming with a vacuum cleaner equipped with a high-efficiency particulate air (HEPA) filter. The dust mite allergic individual should not be in a room which is being cleaned and should wait 1 hour after cleaning before going into the room.  4. Do not sleep on upholstered furniture (eg, couches).  5. If possible removing carpeting, upholstered furniture and drapery from the home is ideal. Horizontal blinds should be eliminated in the rooms where the person spends the most time (bedroom, study, television room). Washable vinyl, roller-type shades are optimal.  6. Remove all non-washable stuffed toys from the bedroom. Wash stuffed toys weekly like sheets and blankets above.  7. Reduce indoor humidity to less than 50%. Inexpensive  humidity monitors can be purchased at most hardware stores. Do not use a humidifier as can make the problem worse and are not recommended.  Control of Cockroach Allergen Cockroach allergen has been identified as an important cause of acute attacks of asthma, especially in urban settings.  There are fifty-five species of cockroach that exist in the [place], however only three, the [place], [place] species produce allergen that can affect patients with Asthma.  Allergens can be obtained from fecal particles, egg casings and secretions from cockroaches.    Remove food sources. Reduce access to water. Seal access and entry points. Spray runways with 0.5-1% Diazinon or Chlorpyrifos Blow boric acid power under stoves and refrigerator. Place bait stations (hydramethylnon) at feeding sites.

## 2022-11-03 NOTE — Progress Notes (Signed)
FOLLOW UP Date of Service/Encounter:  11/03/22   Subjective:  Clifford Williamson (DOB: 1971/03/16) is a 52 y.o. male who returns to the [facility] on 11/03/2022 in re-evaluation of the following: acute visit for asthma flare History obtained from: chart review and patient.  For Review, LV was on 10/14/21  with Gareth Morgan, FNP seen for routine follow-up for asthma, allergic rhinitis, and reflux. See below for summary of history and diagnostics.  Therapeutic plans/changes recommended: continued on plan  Interval Hx: seen in ED 11/01/22 for asthma exacerbation. Not using controller inhaler routinely. Chest x-ray shows mild parabronchial thickening with parabronchial cuffing, possible reactive airway disease versus bronchitis  Tx: duonebs x2, continuous albuterol, observation in CDU  Pertinent History/Diagnostics:  Asthma: Has been on AirDuo 232 in the past. On Xolair, but compliance is an issue. Allergic Rhinitis:  clear rhinorrhea and nasal congestion for which he uses cetirizine as needed and Flonase as needed. dog in the home that does not sleep with him. Has reflux controlled on omeprazole 20 mg daiy. - last environmental allergy skin testing was on 09/28/2016 and was positive to pollen, dust mite, mold, pets, and cockroach   Today presents for follow-up. Asthma is not doing well. HE is having a flare. Was seen in ED 2 days ago. HE is unsure what his trigger was but suspects pollen. He is not using a controller inhaler. He has been off Xolair since last year due to lack of insurance coverage. His insurance has changed and he reports being in contact with our biologics coordinator to see about restarting this medication. He did give himself a nebulizer treatment this morning prior to his visit. He feels tight. He is on steroids prescribed from ED to be taken for an additional 3 days. He did receive steroids while in the ED. He is taking Singulair daily and tessalon pearls. He is  not using antihistamines. He does occasionally use Flonase. Reports ear itching.  Reflux controlled on omeprazole. PCP managing.   Allergies as of 11/03/2022       Reactions   Azithromycin Itching, Rash   Rash/ itching   Nsaids Other (See Comments)   Other reaction(s): ASTHMA Other reaction(s): ASTHMA Other reaction(s): ASTHMA   Tolmetin Other (See Comments)   Other reaction(s): ASTHMA Other reaction(s): ASTHMA   Aspirin Other (See Comments)   Other reaction(s): ASTHMA Triggers asthma   Excedrin Extra Strength  [aspirin-acetaminophen-caffeine]    Ibuprofen    Naproxen Sodium    Other Other (See Comments)        Medication List        Accurate as of November 03, 2022  1:13 PM. If you have any questions, ask your nurse or doctor.          STOP taking these medications    AirDuo Digihaler 232-14 MCG/ACT Aepb Generic drug: Fluticasone-Salmeterol(sensor)       TAKE these medications    albuterol 108 (90 Base) MCG/ACT inhaler Commonly known as: VENTOLIN HFA INHALE 2 PUFFS INTO THE LUNGS EVERY 4 HOURS AS NEEDED FOR WHEEZING OR SHORTNESS OF BREATH   albuterol (2.5 MG/3ML) 0.083% nebulizer solution Commonly known as: PROVENTIL Take 2.5 mg by nebulization every 6 (six) hours as needed for wheezing.   benzonatate 100 MG capsule Commonly known as: TESSALON Take by mouth.   Breztri Aerosphere 160-9-4.8 MCG/ACT Aero Generic drug: Budeson-Glycopyrrol-Formoterol Inhale 2 puffs into the lungs in the morning and at bedtime.   cetirizine 10 MG tablet Commonly known as:  ZYRTEC Take 1 tablet (10 mg total) by mouth daily as needed for allergies.   diphenhydrAMINE 25 MG tablet Commonly known as: BENADRYL Take 25 mg by mouth every 8 (eight) hours as needed for itching.   EPINEPHrine 0.3 mg/0.3 mL Soaj injection Commonly known as: Auvi-Q Use as directed for severe allergic reaction   fluticasone 50 MCG/ACT nasal spray Commonly known as: FLONASE Place 2 sprays into both  nostrils daily.   hydrochlorothiazide 12.5 MG capsule Commonly known as: MICROZIDE Take by mouth.   methocarbamol 500 MG tablet Commonly known as: ROBAXIN Take 500 mg by mouth as needed.   montelukast 10 MG tablet Commonly known as: SINGULAIR Take 1 tablet once a day to prevent cough or wheeze   omalizumab 150 MG/ML prefilled syringe Commonly known as: Xolair Inject 375 mg into the skin every 14 (fourteen) days.   omeprazole 20 MG capsule Commonly known as: PRILOSEC Take 1 capsule by mouth daily.   phentermine 37.5 MG capsule Take by mouth.   predniSONE 20 MG tablet Commonly known as: DELTASONE Take by mouth.   traMADol 50 MG tablet Commonly known as: ULTRAM Take 50 mg by mouth every 8 (eight) hours as needed.   zolpidem 12.5 MG CR tablet Commonly known as: AMBIEN CR Take 12.5 mg by mouth.       Past Medical History:  Diagnosis Date   Asthma    DM (diabetes mellitus) ([place])    Hypertension    Insomnia    OSA (obstructive sleep apnea) 01/09/2013   Past Surgical History:  Procedure Laterality Date   VASECTOMY     Otherwise, there have been no changes to his past medical history, surgical history, family history, or social history.  ROS: All others negative except as noted per HPI.   Objective:  BP (!) 158/80   Pulse 96   Temp 98 F (36.7 C) (Temporal)   Resp 20   SpO2 96%  There is no height or weight on file to calculate BMI. Physical Exam: General Appearance:  Alert, cooperative, no distress, appears stated age  Head:  Normocephalic, without obvious abnormality, atraumatic  Eyes:  Conjunctiva clear, EOM's intact  Nose: Nares normal, hypertrophic turbinates, normal mucosa, and no visible anterior polyps  Throat: Lips, tongue normal; teeth and gums normal, normal posterior oropharynx  Neck: Supple, symmetrical  Lungs:   Prolonged expiratory phase and clear to auscultation bilaterally, Respirations unlabored, no coughing  Heart:  regular rate and  rhythm and no murmur, Appears well perfused  Extremities: No edema  Skin: Skin color, texture, turgor normal and no rashes or lesions on visualized portions of skin  Neurologic: No gross deficits   Spirometry:  Tracings reviewed. His effort: Good reproducible efforts. FVC: 2.45L FEV1: 1.46L, 49% predicted FEV1/FVC ratio: 0.59 Interpretation: Spirometry consistent with severe obstructive disease and restriction Please see scanned spirometry results for details.  Assessment/Plan   Moderate persistent asthma with exacerbation 40 mg IM depomedrol today in clinic Continue prednisone 40 mg for 4 days then 20 mg on day 5 then stop. For the next 2-3 days, continue albuterol 1 vial via nebulizer OR 6 puffs via inhaler every 4 hours while awake to help keep lungs open Start Breztri 2 puffs twice daily with a spacer. Rinse mouth out after use. USE EVERYDAY. Continue montelukast 10 mg once a day to prevent cough, wheeze, and shortness of breath Continue albuterol 2 puffs every 4 hours as needed for cough, wheeze, and shortness of breath Continue albuterol 2 puffs  5 to 15 minutes before activity to decrease cough or wheeze Restart Xolair 375 mg injections once every 2 weeks and have access to an epinephrine auto-injector set-will send message to [employer] given.  Spacer provided   Allergic rhinitis-not controlled Continue Flonase nasal spray 2 sprays once a day to control stuffy nose. Try using this every day to help with nasal congestion. In the right nostril, point the applicator out toward the right ear. In the left nostril, point the applicator out toward the left ear Continue an antihistamine once a day for a runny nose or itch.Remember to rotate to a different antihistamine about every 3 months. Some examples of over the counter antihistamines include Zyrtec (cetirizine), Xyzal (levocetirizine), Allegra (fexofenadine), and Claritin (loratidine).  You may use nasal saline rinses as  needed for nasal symptoms. Use this before using medicated nasal sprays Start Azelastine nasal spray 1-2 sprays up to 2 times daily for itchy nose/itchy ears/nasal congestion/sneezing.  Continue allergen avoidance measures directed toward pollens, dust mite, mold, pet, and cockroach as listed below Samples Allegra and Xyzal given.   Reflux-controlled Continue omeprazole 20 mg once a day as previously prescribed-PCP managing Continue dietary and lifestyle modifications as listed below  Please let us know if this treatment plan is not working for you  Schedule a follow up in 3 months or sooner as needed  Sigurd Sos, MD  [facility] [place] [facility]

## 2022-11-09 ENCOUNTER — Telehealth: Payer: Self-pay | Admitting: *Deleted

## 2022-11-09 NOTE — Telephone Encounter (Signed)
-----   Message from Clemon Chambers, MD sent at 11/03/2022  1:16 PM EDT ----- Hi Jenaveve Fenstermaker! Jimi said he spoke with you about new insurance and restarting Xolair. He is having a flare. Let me know if you need anything else from me.

## 2022-11-09 NOTE — Telephone Encounter (Signed)
Patient gave me alternative Ins info and I have started approval. Will reach out to patient once I get status for further instructions

## 2022-11-22 DIAGNOSIS — H6123 Impacted cerumen, bilateral: Secondary | ICD-10-CM | POA: Diagnosis not present

## 2022-11-24 NOTE — Telephone Encounter (Signed)
Called patient to advise Ins will not approve Xolair for asthma since Nucala is preferred by plan. I did get appproval, copay card and will submit to [employer] and reach out to patient to schedule once delivery set

## 2022-12-08 DIAGNOSIS — J4541 Moderate persistent asthma with (acute) exacerbation: Secondary | ICD-10-CM | POA: Diagnosis not present

## 2022-12-29 ENCOUNTER — Ambulatory Visit: Payer: 59

## 2022-12-29 DIAGNOSIS — Z125 Encounter for screening for malignant neoplasm of prostate: Secondary | ICD-10-CM | POA: Diagnosis not present

## 2022-12-29 DIAGNOSIS — I10 Essential (primary) hypertension: Secondary | ICD-10-CM | POA: Diagnosis not present

## 2022-12-29 DIAGNOSIS — Z0000 Encounter for general adult medical examination without abnormal findings: Secondary | ICD-10-CM | POA: Diagnosis not present

## 2022-12-29 DIAGNOSIS — R051 Acute cough: Secondary | ICD-10-CM | POA: Diagnosis not present

## 2022-12-29 DIAGNOSIS — R7303 Prediabetes: Secondary | ICD-10-CM | POA: Diagnosis not present

## 2022-12-30 ENCOUNTER — Ambulatory Visit: Payer: 59

## 2023-01-07 DIAGNOSIS — Z20822 Contact with and (suspected) exposure to covid-19: Secondary | ICD-10-CM | POA: Diagnosis not present

## 2023-01-07 DIAGNOSIS — R509 Fever, unspecified: Secondary | ICD-10-CM | POA: Diagnosis not present

## 2023-01-07 DIAGNOSIS — R7989 Other specified abnormal findings of blood chemistry: Secondary | ICD-10-CM | POA: Diagnosis not present

## 2023-01-07 DIAGNOSIS — R0602 Shortness of breath: Secondary | ICD-10-CM | POA: Diagnosis not present

## 2023-01-07 DIAGNOSIS — J4541 Moderate persistent asthma with (acute) exacerbation: Secondary | ICD-10-CM | POA: Diagnosis not present

## 2023-01-08 DIAGNOSIS — R7989 Other specified abnormal findings of blood chemistry: Secondary | ICD-10-CM | POA: Diagnosis not present

## 2023-01-08 DIAGNOSIS — Z20822 Contact with and (suspected) exposure to covid-19: Secondary | ICD-10-CM | POA: Diagnosis not present

## 2023-01-08 DIAGNOSIS — J4541 Moderate persistent asthma with (acute) exacerbation: Secondary | ICD-10-CM | POA: Diagnosis not present

## 2023-01-09 ENCOUNTER — Ambulatory Visit (INDEPENDENT_AMBULATORY_CARE_PROVIDER_SITE_OTHER): Payer: 59

## 2023-01-09 DIAGNOSIS — J4550 Severe persistent asthma, uncomplicated: Secondary | ICD-10-CM | POA: Diagnosis not present

## 2023-01-09 MED ORDER — MEPOLIZUMAB 100 MG ~~LOC~~ SOLR
100.0000 mg | SUBCUTANEOUS | Status: DC
  Administered 2023-01-09 – 2024-01-04 (×13): 100 mg via SUBCUTANEOUS

## 2023-01-09 NOTE — Progress Notes (Signed)
Immunotherapy   Patient Details  Name: Clifford Williamson MRN: [id number] Date of Birth: 10-18-1970  01/09/2023  [name] started injections for Nucala 100mg /ml  Frequency: Q 4 weeks Epi-Pen:Epi-Pen Available  Consent signed and patient instructions given.   [name] J Naamah Boggess 01/09/2023, 1:59 PM

## 2023-02-06 ENCOUNTER — Telehealth: Payer: Self-pay

## 2023-02-06 NOTE — Telephone Encounter (Signed)
Left patient a message that I had a biologic cancellation for tomorrow. I put Clifford Williamson in that cancellation spot so no one would take it. I asked him to call the office to confirm if 9:00 am appointment will work for tomorrow, then will cancel his nucala appointment for next week.

## 2023-02-07 ENCOUNTER — Ambulatory Visit: Payer: 59

## 2023-02-07 NOTE — Telephone Encounter (Signed)
Left patient 2 messages, patient never came in on the second of July bc that's the day he really needed.

## 2023-02-15 ENCOUNTER — Ambulatory Visit (INDEPENDENT_AMBULATORY_CARE_PROVIDER_SITE_OTHER): Payer: 59

## 2023-02-15 DIAGNOSIS — J4550 Severe persistent asthma, uncomplicated: Secondary | ICD-10-CM | POA: Diagnosis not present

## 2023-03-14 ENCOUNTER — Ambulatory Visit: Payer: 59

## 2023-03-17 ENCOUNTER — Ambulatory Visit: Payer: 59

## 2023-03-17 ENCOUNTER — Ambulatory Visit (INDEPENDENT_AMBULATORY_CARE_PROVIDER_SITE_OTHER): Payer: 59

## 2023-03-17 DIAGNOSIS — J4550 Severe persistent asthma, uncomplicated: Secondary | ICD-10-CM

## 2023-04-11 ENCOUNTER — Ambulatory Visit (INDEPENDENT_AMBULATORY_CARE_PROVIDER_SITE_OTHER): Payer: 59

## 2023-04-11 DIAGNOSIS — J4550 Severe persistent asthma, uncomplicated: Secondary | ICD-10-CM

## 2023-04-12 ENCOUNTER — Ambulatory Visit: Payer: 59

## 2023-05-10 NOTE — Patient Instructions (Signed)
Moderate persistent asthma  Start Breztri 2 puffs twice daily with a spacer. Rinse mouth out after use. USE EVERYDAY. Continue montelukast 10 mg once a day to prevent cough, wheeze, and shortness of breath Continue albuterol 2 puffs every 4 hours as needed for cough, wheeze, and shortness of breath Continue albuterol 2 puffs 5 to 15 minutes before activity to decrease cough or wheeze Continue Nucala injections every 4 weeks  Allergic rhinitis Continue Flonase nasal spray 2 sprays once a day to control stuffy nose. Try using this every day to help with nasal congestion. In the right nostril, point the applicator out toward the right ear. In the left nostril, point the applicator out toward the left ear Continue an antihistamine once a day for a runny nose or itch.Remember to rotate to a different antihistamine about every 3 months. Some examples of over the counter antihistamines include Zyrtec (cetirizine), Xyzal (levocetirizine), Allegra (fexofenadine), and Claritin (loratidine).  You may use nasal saline rinses as needed for nasal symptoms. Use this before using medicated nasal sprays Continue azelastine nasal spray 1-2 sprays up to 2 times daily for itchy nose/itchy ears/nasal congestion/sneezing.  Continue allergen avoidance measures directed toward pollens, dust mite, mold, pet, and cockroach as listed below  Reflux Continue omeprazole 20 mg once a day as previously prescribed Continue dietary and lifestyle modifications as listed below  Please let us know if this treatment plan is not working for you  Schedule a follow up in  months or sooner as needed  Reducing Pollen Exposure The American Academy of Allergy, Asthma and Immunology suggests the following steps to reduce your exposure to pollen during allergy seasons. Do not hang sheets or clothing out to dry; pollen may collect on these items. Do not mow lawns or spend time around freshly cut grass; mowing stirs up pollen. Keep  windows closed at night.  Keep car windows closed while driving. Minimize morning activities outdoors, a time when pollen counts are usually at their highest. Stay indoors as much as possible when pollen counts or humidity is high and on windy days when pollen tends to remain in the air longer. Use air conditioning when possible.  Many air conditioners have filters that trap the pollen spores. Use a HEPA room air filter to remove pollen form the indoor air you breathe.  Control of Mold Allergen Mold and fungi can grow on a variety of surfaces provided certain temperature and moisture conditions exist.  Outdoor molds grow on plants, decaying vegetation and soil.  The major outdoor mold, Alternaria and Cladosporium, are found in very high numbers during hot and dry conditions.  Generally, a late Summer - Fall peak is seen for common outdoor fungal spores.  Rain will temporarily lower outdoor mold spore count, but counts rise rapidly when the rainy period ends.  The most important indoor molds are Aspergillus and Penicillium.  Dark, humid and poorly ventilated basements are ideal sites for mold growth.  The next most common sites of mold growth are the bathroom and the kitchen.  Outdoor [employer] Use air conditioning and keep windows closed Avoid exposure to decaying vegetation. Avoid leaf raking. Avoid grain handling. Consider wearing a face mask if working in moldy areas.  Indoor Mold Control Maintain humidity below 50%. Clean washable surfaces with 5% bleach solution. Remove sources e.g. Contaminated carpets.   Control of Dust Mite Allergen Dust mites play a major role in allergic asthma and rhinitis. They occur in environments with high humidity wherever human skin  is found. Dust mites absorb humidity from the atmosphere (ie, they do not drink) and feed on organic matter (including shed human and animal skin). Dust mites are a microscopic type of insect that you cannot see with the naked  eye. High levels of dust mites have been detected from mattresses, pillows, carpets, upholstered furniture, bed covers, clothes, soft toys and any woven material. The principal allergen of the dust mite is found in its feces. A gram of dust may contain 1,000 mites and 250,000 fecal particles. Mite antigen is easily measured in the air during house cleaning activities. Dust mites do not bite and do not cause harm to humans, other than by triggering allergies/asthma.  Ways to decrease your exposure to dust mites in your home:  1. Encase mattresses, box springs and pillows with a mite-impermeable barrier or cover  2. Wash sheets, blankets and drapes weekly in hot water (130 F) with detergent and dry them in a dryer on the hot setting.  3. Have the room cleaned frequently with a vacuum cleaner and a damp dust-mop. For carpeting or rugs, vacuuming with a vacuum cleaner equipped with a high-efficiency particulate air (HEPA) filter. The dust mite allergic individual should not be in a room which is being cleaned and should wait 1 hour after cleaning before going into the room.  4. Do not sleep on upholstered furniture (eg, couches).  5. If possible removing carpeting, upholstered furniture and drapery from the home is ideal. Horizontal blinds should be eliminated in the rooms where the person spends the most time (bedroom, study, television room). Washable vinyl, roller-type shades are optimal.  6. Remove all non-washable stuffed toys from the bedroom. Wash stuffed toys weekly like sheets and blankets above.  7. Reduce indoor humidity to less than 50%. Inexpensive humidity monitors can be purchased at most hardware stores. Do not use a humidifier as can make the problem worse and are not recommended.  Control of Cockroach Allergen Cockroach allergen has been identified as an important cause of acute attacks of asthma, especially in urban settings.  There are fifty-five species of cockroach that exist in  the [place], however only three, the [place], [place] species produce allergen that can affect patients with Asthma.  Allergens can be obtained from fecal particles, egg casings and secretions from cockroaches.    Remove food sources. Reduce access to water. Seal access and entry points. Spray runways with 0.5-1% Diazinon or Chlorpyrifos Blow boric acid power under stoves and refrigerator. Place bait stations (hydramethylnon) at feeding sites.

## 2023-05-11 ENCOUNTER — Encounter: Payer: Self-pay | Admitting: Family

## 2023-05-11 ENCOUNTER — Ambulatory Visit: Payer: 59 | Admitting: Family

## 2023-05-11 ENCOUNTER — Ambulatory Visit: Payer: 59

## 2023-05-11 ENCOUNTER — Ambulatory Visit (INDEPENDENT_AMBULATORY_CARE_PROVIDER_SITE_OTHER): Payer: 59 | Admitting: Family

## 2023-05-11 VITALS — BP 136/86 | HR 88 | Temp 97.9°F | Resp 18 | Ht 66.5 in | Wt 217.2 lb

## 2023-05-11 DIAGNOSIS — J302 Other seasonal allergic rhinitis: Secondary | ICD-10-CM

## 2023-05-11 DIAGNOSIS — J4540 Moderate persistent asthma, uncomplicated: Secondary | ICD-10-CM | POA: Diagnosis not present

## 2023-05-11 DIAGNOSIS — J3089 Other allergic rhinitis: Secondary | ICD-10-CM

## 2023-05-11 DIAGNOSIS — K219 Gastro-esophageal reflux disease without esophagitis: Secondary | ICD-10-CM | POA: Diagnosis not present

## 2023-05-11 MED ORDER — TRELEGY ELLIPTA 200-62.5-25 MCG/ACT IN AEPB: INHALATION_SPRAY | RESPIRATORY_TRACT | 5 refills | Status: DC

## 2023-05-11 NOTE — Progress Notes (Signed)
[address] [place] [address] Dept: [phone]  FOLLOW UP NOTE  Patient ID: Clifford Williamson, male    DOB: [birth year]  Age: 52 y.o. MRN: [id number] Date of Office Visit: 05/11/2023  Assessment  Chief Complaint: Follow-up, Asthma (Pt states no improvements in breathing symptoms.), Allergic Rhinitis  (Pt states he's had nasal congestion for 3 days now. And he took allegra but ran outside.), and Nasal Congestion  HPI Clifford Williamson is a 52 year old male who presents today for follow-up of moderate persistent asthma with exacerbation, allergic rhinitis, and reflux.  He was last seen on November 03, 2022 by Dr. Maurine Minister.  He denies any new diagnosis or surgery since his last office visit.  Moderate persistent asthma: He is currently still using the sample of Breztri that was given at his last office visit.  He reports he does not use  Breztri every day.  He is taking Singulair 10 mg once a day and albuterol 1-2 times a day.  He also receives Nucala injections every 4 weeks, but thinks the dosage should be stronger.  He also feels like Xolair worked better.  He was previously on Xolair, but insurance no longer approved.  He reports very little dry cough and wheezing.  He denies fever, chills, tightness in chest, shortness of breath, and nocturnal awakenings due to breathing problems.  He reports he walks a lot and does go to the gym some.  Since his last office visit he has been to the emergency room once for an asthma flare and received steroids twice. One round of steroids was from his primary care physician in May and the other in June 1,2024 from the emergency room.  He reports that his insurance does not cover inhalers and they are very expensive.  At his emergency room visit on June 1,2024 he went with an asthma flare due to running out of his inhaler.  He was given neb treatments and dexamethasone.  He also had a CT angio chest PE showing: "1. No pulmonary embolus.  2. Bronchial thickening  without focal airspace disease.  3. Small hiatal hernia with patulous distal esophagus.  4. Avascular necrosis of the right humeral head. "  His chest x-ray from the same emergency room visit on January 07, 2023 shows: "No active cardiopulmonary disease.  Stable chest."  Allergic rhinitis: He reports nasal congestion and denies rhinorrhea and postnasal drip.  He reports that fall is the worst season for him.  He has not been treated for any sinus infections since we last saw him.  Currently he is taking Benadryl at night.  He asked if we have any samples of Allegra.  He was not able to pick up the azelastine that was prescribed at the last office visit due to it being too expensive.  He does use Flonase nasal spray nightly.  Reflux is reported as being "all right.".  He continues to take omeprazole 20 mg once a day.  He denies heartburn or reflux symptoms.  Drug Allergies:  Allergies  Allergen Reactions   Azithromycin Itching and Rash    Rash/ itching   Nsaids Other (See Comments)    Other reaction(s): ASTHMA Other reaction(s): ASTHMA Other reaction(s): ASTHMA   Tolmetin Other (See Comments)    Other reaction(s): ASTHMA Other reaction(s): ASTHMA   Naproxen Sodium     Review of Systems: Negative except as per HPI   Physical Exam: BP 136/86   Pulse 88   Temp 97.9 F (36.6 C) (Temporal)  Resp 18   Ht 5' 6.5" (1.689 m)   Wt 217 lb 3.2 oz (98.5 kg)   SpO2 97%   BMI 34.53 kg/m    Physical Exam Constitutional:      Appearance: Normal appearance.  HENT:     Head: Normocephalic and atraumatic.     Comments: Pharynx normal, eyes normal, ears normal, nose: Turbinates moderately edematous with no drainage noted.    Right Ear: Tympanic membrane, ear canal and external ear normal.     Left Ear: Tympanic membrane, ear canal and external ear normal.     Mouth/Throat:     Mouth: Mucous membranes are moist.     Pharynx: Oropharynx is clear.  Eyes:     Conjunctiva/sclera: Conjunctivae  normal.  Cardiovascular:     Rate and Rhythm: Regular rhythm.     Heart sounds: Normal heart sounds.  Pulmonary:     Effort: Pulmonary effort is normal.     Breath sounds: Normal breath sounds.     Comments: Lungs clear to auscultation and diminished in the bases Musculoskeletal:     Cervical back: Neck supple.  Skin:    General: Skin is warm.  Neurological:     Mental Status: He is alert and oriented to person, place, and time.  Psychiatric:        Mood and Affect: Mood normal.        Behavior: Behavior normal.        Thought Content: Thought content normal.        Judgment: Judgment normal.     Diagnostics: FVC 2.54 L (70%), FEV1 1.66 L (57%), FEV1/FVC 0.65.  Spirometry indicates moderate airway obstruction.  Possible mixed defect.  Assessment and Plan: 1. Seasonal and perennial allergic rhinitis   2. Not well controlled moderate persistent asthma   3. Gastroesophageal reflux disease, unspecified whether esophagitis present     Meds ordered this encounter  Medications   Fluticasone-Umeclidin-Vilant (TRELEGY ELLIPTA) 200-62.5-25 MCG/ACT AEPB    Sig: Inhale 1 puff once a day to help prevent cough and wheeze.  Rinse mouth out afterwards.    Dispense:  28 each    Refill:  5    Patient has coupon that he is bringing with him.    Patient Instructions  Moderate persistent asthma-not well-controlled -Stop Breztri sample -Start Trelegy 200 mcg 1 puff once a day.  Rinse mouth out afterwards. Demonstration given. Make sure to bring the coupon with you to the pharmacy to see if you qualify for $0 co-pay.  Please let us know if you are not able to get this medication. Continue montelukast 10 mg once a day to prevent cough, wheeze, and shortness of breath Continue albuterol 2 puffs every 4 hours as needed for cough, wheeze, and shortness of breath Continue albuterol 2 puffs 5 to 15 minutes before activity to decrease cough or wheeze Continue Nucala injections every 4  weeks  Asthma control goals:  Full participation in all desired activities (may need albuterol before activity) Albuterol use two time or less a week on average (not counting use with activity) Cough interfering with sleep two time or less a month Oral steroids no more than once a year No hospitalizations   Allergic rhinitis Continue Flonase nasal spray 2 sprays once a day to control stuffy nose. Try using this every day to help with nasal congestion. In the right nostril, point the applicator out toward the right ear. In the left nostril, point the applicator out toward the left ear  Continue an antihistamine once a day for a runny nose or itch.Remember to rotate to a different antihistamine about every 3 months. Some examples of over the counter antihistamines include Zyrtec (cetirizine), Xyzal (levocetirizine), Allegra (fexofenadine), and Claritin (loratidine).  Samples of Allegra given. You may use nasal saline rinses as needed for nasal symptoms. Use this before using medicated nasal sprays Continue allergen avoidance measures directed toward pollens, dust mite, mold, pet, and cockroach as listed below  Reflux Continue omeprazole 20 mg once a day as previously prescribed Continue dietary and lifestyle modifications as listed below  Please let us know if this treatment plan is not working for you  Schedule a follow up in 6-8 weeks or sooner as needed  Reducing Pollen Exposure The American Academy of Allergy, Asthma and Immunology suggests the following steps to reduce your exposure to pollen during allergy seasons. Do not hang sheets or clothing out to dry; pollen may collect on these items. Do not mow lawns or spend time around freshly cut grass; mowing stirs up pollen. Keep windows closed at night.  Keep car windows closed while driving. Minimize morning activities outdoors, a time when pollen counts are usually at their highest. Stay indoors as much as possible when pollen counts  or humidity is high and on windy days when pollen tends to remain in the air longer. Use air conditioning when possible.  Many air conditioners have filters that trap the pollen spores. Use a HEPA room air filter to remove pollen form the indoor air you breathe.  Control of Mold Allergen Mold and fungi can grow on a variety of surfaces provided certain temperature and moisture conditions exist.  Outdoor molds grow on plants, decaying vegetation and soil.  The major outdoor mold, Alternaria and Cladosporium, are found in very high numbers during hot and dry conditions.  Generally, a late Summer - Fall peak is seen for common outdoor fungal spores.  Rain will temporarily lower outdoor mold spore count, but counts rise rapidly when the rainy period ends.  The most important indoor molds are Aspergillus and Penicillium.  Dark, humid and poorly ventilated basements are ideal sites for mold growth.  The next most common sites of mold growth are the bathroom and the kitchen.  Outdoor [employer] Use air conditioning and keep windows closed Avoid exposure to decaying vegetation. Avoid leaf raking. Avoid grain handling. Consider wearing a face mask if working in moldy areas.  Indoor Mold Control Maintain humidity below 50%. Clean washable surfaces with 5% bleach solution. Remove sources e.g. Contaminated carpets.   Control of Dust Mite Allergen Dust mites play a major role in allergic asthma and rhinitis. They occur in environments with high humidity wherever human skin is found. Dust mites absorb humidity from the atmosphere (ie, they do not drink) and feed on organic matter (including shed human and animal skin). Dust mites are a microscopic type of insect that you cannot see with the naked eye. High levels of dust mites have been detected from mattresses, pillows, carpets, upholstered furniture, bed covers, clothes, soft toys and any woven material. The principal allergen of the dust mite is found in  its feces. A gram of dust may contain 1,000 mites and 250,000 fecal particles. Mite antigen is easily measured in the air during house cleaning activities. Dust mites do not bite and do not cause harm to humans, other than by triggering allergies/asthma.  Ways to decrease your exposure to dust mites in your home:  1. Encase mattresses, box  springs and pillows with a mite-impermeable barrier or cover  2. Wash sheets, blankets and drapes weekly in hot water (130 F) with detergent and dry them in a dryer on the hot setting.  3. Have the room cleaned frequently with a vacuum cleaner and a damp dust-mop. For carpeting or rugs, vacuuming with a vacuum cleaner equipped with a high-efficiency particulate air (HEPA) filter. The dust mite allergic individual should not be in a room which is being cleaned and should wait 1 hour after cleaning before going into the room.  4. Do not sleep on upholstered furniture (eg, couches).  5. If possible removing carpeting, upholstered furniture and drapery from the home is ideal. Horizontal blinds should be eliminated in the rooms where the person spends the most time (bedroom, study, television room). Washable vinyl, roller-type shades are optimal.  6. Remove all non-washable stuffed toys from the bedroom. Wash stuffed toys weekly like sheets and blankets above.  7. Reduce indoor humidity to less than 50%. Inexpensive humidity monitors can be purchased at most hardware stores. Do not use a humidifier as can make the problem worse and are not recommended.  Control of Cockroach Allergen Cockroach allergen has been identified as an important cause of acute attacks of asthma, especially in urban settings.  There are fifty-five species of cockroach that exist in the [place], however only three, the [place], [place] species produce allergen that can affect patients with Asthma.  Allergens can be obtained from fecal particles, egg casings and secretions  from cockroaches.    Remove food sources. Reduce access to water. Seal access and entry points. Spray runways with 0.5-1% Diazinon or Chlorpyrifos Blow boric acid power under stoves and refrigerator. Place bait stations (hydramethylnon) at feeding sites.  Return in about 6 weeks (around 06/22/2023), or if symptoms worsen or fail to improve.    Thank you for the opportunity to care for this patient.  Please do not hesitate to contact me with questions.  Nehemiah Settle, FNP [facility]

## 2023-05-11 NOTE — Patient Instructions (Addendum)
Moderate persistent asthma-not well-controlled -Stop Breztri sample -Start Trelegy 200 mcg 1 puff once a day.  Rinse mouth out afterwards. Demonstration given. Make sure to bring the coupon with you to the pharmacy to see if you qualify for $0 co-pay.  Please let us know if you are not able to get this medication. Continue montelukast 10 mg once a day to prevent cough, wheeze, and shortness of breath Continue albuterol 2 puffs every 4 hours as needed for cough, wheeze, and shortness of breath Continue albuterol 2 puffs 5 to 15 minutes before activity to decrease cough or wheeze Continue Nucala injections every 4 weeks  Asthma control goals:  Full participation in all desired activities (may need albuterol before activity) Albuterol use two time or less a week on average (not counting use with activity) Cough interfering with sleep two time or less a month Oral steroids no more than once a year No hospitalizations   Allergic rhinitis Continue Flonase nasal spray 2 sprays once a day to control stuffy nose. Try using this every day to help with nasal congestion. In the right nostril, point the applicator out toward the right ear. In the left nostril, point the applicator out toward the left ear Continue an antihistamine once a day for a runny nose or itch.Remember to rotate to a different antihistamine about every 3 months. Some examples of over the counter antihistamines include Zyrtec (cetirizine), Xyzal (levocetirizine), Allegra (fexofenadine), and Claritin (loratidine).  Samples of Allegra given. You may use nasal saline rinses as needed for nasal symptoms. Use this before using medicated nasal sprays Continue allergen avoidance measures directed toward pollens, dust mite, mold, pet, and cockroach as listed below  Reflux Continue omeprazole 20 mg once a day as previously prescribed Continue dietary and lifestyle modifications as listed below  Please let us know if this treatment plan is  not working for you  Schedule a follow up in 6-8 weeks or sooner as needed  Reducing Pollen Exposure The American Academy of Allergy, Asthma and Immunology suggests the following steps to reduce your exposure to pollen during allergy seasons. Do not hang sheets or clothing out to dry; pollen may collect on these items. Do not mow lawns or spend time around freshly cut grass; mowing stirs up pollen. Keep windows closed at night.  Keep car windows closed while driving. Minimize morning activities outdoors, a time when pollen counts are usually at their highest. Stay indoors as much as possible when pollen counts or humidity is high and on windy days when pollen tends to remain in the air longer. Use air conditioning when possible.  Many air conditioners have filters that trap the pollen spores. Use a HEPA room air filter to remove pollen form the indoor air you breathe.  Control of Mold Allergen Mold and fungi can grow on a variety of surfaces provided certain temperature and moisture conditions exist.  Outdoor molds grow on plants, decaying vegetation and soil.  The major outdoor mold, Alternaria and Cladosporium, are found in very high numbers during hot and dry conditions.  Generally, a late Summer - Fall peak is seen for common outdoor fungal spores.  Rain will temporarily lower outdoor mold spore count, but counts rise rapidly when the rainy period ends.  The most important indoor molds are Aspergillus and Penicillium.  Dark, humid and poorly ventilated basements are ideal sites for mold growth.  The next most common sites of mold growth are the bathroom and the kitchen.  Outdoor [employer] Use  air conditioning and keep windows closed Avoid exposure to decaying vegetation. Avoid leaf raking. Avoid grain handling. Consider wearing a face mask if working in moldy areas.  Indoor Mold Control Maintain humidity below 50%. Clean washable surfaces with 5% bleach solution. Remove sources e.g.  Contaminated carpets.   Control of Dust Mite Allergen Dust mites play a major role in allergic asthma and rhinitis. They occur in environments with high humidity wherever human skin is found. Dust mites absorb humidity from the atmosphere (ie, they do not drink) and feed on organic matter (including shed human and animal skin). Dust mites are a microscopic type of insect that you cannot see with the naked eye. High levels of dust mites have been detected from mattresses, pillows, carpets, upholstered furniture, bed covers, clothes, soft toys and any woven material. The principal allergen of the dust mite is found in its feces. A gram of dust may contain 1,000 mites and 250,000 fecal particles. Mite antigen is easily measured in the air during house cleaning activities. Dust mites do not bite and do not cause harm to humans, other than by triggering allergies/asthma.  Ways to decrease your exposure to dust mites in your home:  1. Encase mattresses, box springs and pillows with a mite-impermeable barrier or cover  2. Wash sheets, blankets and drapes weekly in hot water (130 F) with detergent and dry them in a dryer on the hot setting.  3. Have the room cleaned frequently with a vacuum cleaner and a damp dust-mop. For carpeting or rugs, vacuuming with a vacuum cleaner equipped with a high-efficiency particulate air (HEPA) filter. The dust mite allergic individual should not be in a room which is being cleaned and should wait 1 hour after cleaning before going into the room.  4. Do not sleep on upholstered furniture (eg, couches).  5. If possible removing carpeting, upholstered furniture and drapery from the home is ideal. Horizontal blinds should be eliminated in the rooms where the person spends the most time (bedroom, study, television room). Washable vinyl, roller-type shades are optimal.  6. Remove all non-washable stuffed toys from the bedroom. Wash stuffed toys weekly like sheets and blankets  above.  7. Reduce indoor humidity to less than 50%. Inexpensive humidity monitors can be purchased at most hardware stores. Do not use a humidifier as can make the problem worse and are not recommended.  Control of Cockroach Allergen Cockroach allergen has been identified as an important cause of acute attacks of asthma, especially in urban settings.  There are fifty-five species of cockroach that exist in the [place], however only three, the [place], [place] species produce allergen that can affect patients with Asthma.  Allergens can be obtained from fecal particles, egg casings and secretions from cockroaches.    Remove food sources. Reduce access to water. Seal access and entry points. Spray runways with 0.5-1% Diazinon or Chlorpyrifos Blow boric acid power under stoves and refrigerator. Place bait stations (hydramethylnon) at feeding sites.

## 2023-05-12 NOTE — Addendum Note (Signed)
Addended by: Berna Bue on: 05/12/2023 08:20 AM   Modules accepted: Orders

## 2023-06-08 ENCOUNTER — Ambulatory Visit (INDEPENDENT_AMBULATORY_CARE_PROVIDER_SITE_OTHER): Payer: 59 | Admitting: *Deleted

## 2023-06-08 ENCOUNTER — Telehealth: Payer: Self-pay | Admitting: *Deleted

## 2023-06-08 DIAGNOSIS — J4550 Severe persistent asthma, uncomplicated: Secondary | ICD-10-CM

## 2023-06-08 NOTE — Telephone Encounter (Signed)
Error

## 2023-07-04 ENCOUNTER — Ambulatory Visit (INDEPENDENT_AMBULATORY_CARE_PROVIDER_SITE_OTHER): Payer: 59

## 2023-07-04 DIAGNOSIS — J4550 Severe persistent asthma, uncomplicated: Secondary | ICD-10-CM | POA: Diagnosis not present

## 2023-07-09 NOTE — Patient Instructions (Incomplete)
Moderate persistent asthma- -Continue Trelegy 200 mcg 1 puff once a day.  Rinse mouth out afterwards.  Continue montelukast 10 mg once a day to prevent cough, wheeze, and shortness of breath Continue albuterol 2 puffs every 4 hours as needed for cough, wheeze, and shortness of breath Continue albuterol 2 puffs 5 to 15 minutes before activity to decrease cough or wheeze Continue Nucala injections every 4 weeks  Asthma control goals:  Full participation in all desired activities (may need albuterol before activity) Albuterol use two time or less a week on average (not counting use with activity) Cough interfering with sleep two time or less a month Oral steroids no more than once a year No hospitalizations   Allergic rhinitis Continue Flonase nasal spray 2 sprays once a day to control stuffy nose. Try using this every day to help with nasal congestion. In the right nostril, point the applicator out toward the right ear. In the left nostril, point the applicator out toward the left ear Continue an antihistamine once a day for a runny nose or itch.Remember to rotate to a different antihistamine about every 3 months. Some examples of over the counter antihistamines include Zyrtec (cetirizine), Xyzal (levocetirizine), Allegra (fexofenadine), and Claritin (loratidine).  Samples of Allegra given. You may use nasal saline rinses as needed for nasal symptoms. Use this before using medicated nasal sprays Continue allergen avoidance measures directed toward pollens, dust mite, mold, pet, and cockroach as listed below  Reflux Continue omeprazole 20 mg once a day as previously prescribed Continue dietary and lifestyle modifications as listed below  Please let us know if this treatment plan is not working for you  Schedule a follow up in 6-8 weeks or sooner as needed  Reducing Pollen Exposure The American Academy of Allergy, Asthma and Immunology suggests the following steps to reduce your exposure to  pollen during allergy seasons. Do not hang sheets or clothing out to dry; pollen may collect on these items. Do not mow lawns or spend time around freshly cut grass; mowing stirs up pollen. Keep windows closed at night.  Keep car windows closed while driving. Minimize morning activities outdoors, a time when pollen counts are usually at their highest. Stay indoors as much as possible when pollen counts or humidity is high and on windy days when pollen tends to remain in the air longer. Use air conditioning when possible.  Many air conditioners have filters that trap the pollen spores. Use a HEPA room air filter to remove pollen form the indoor air you breathe.  Control of Mold Allergen Mold and fungi can grow on a variety of surfaces provided certain temperature and moisture conditions exist.  Outdoor molds grow on plants, decaying vegetation and soil.  The major outdoor mold, Alternaria and Cladosporium, are found in very high numbers during hot and dry conditions.  Generally, a late Summer - Fall peak is seen for common outdoor fungal spores.  Rain will temporarily lower outdoor mold spore count, but counts rise rapidly when the rainy period ends.  The most important indoor molds are Aspergillus and Penicillium.  Dark, humid and poorly ventilated basements are ideal sites for mold growth.  The next most common sites of mold growth are the bathroom and the kitchen.  Outdoor [employer] Use air conditioning and keep windows closed Avoid exposure to decaying vegetation. Avoid leaf raking. Avoid grain handling. Consider wearing a face mask if working in moldy areas.  Indoor Mold Control Maintain humidity below 50%. Clean washable surfaces  with 5% bleach solution. Remove sources e.g. Contaminated carpets.   Control of Dust Mite Allergen Dust mites play a major role in allergic asthma and rhinitis. They occur in environments with high humidity wherever human skin is found. Dust mites absorb  humidity from the atmosphere (ie, they do not drink) and feed on organic matter (including shed human and animal skin). Dust mites are a microscopic type of insect that you cannot see with the naked eye. High levels of dust mites have been detected from mattresses, pillows, carpets, upholstered furniture, bed covers, clothes, soft toys and any woven material. The principal allergen of the dust mite is found in its feces. A gram of dust may contain 1,000 mites and 250,000 fecal particles. Mite antigen is easily measured in the air during house cleaning activities. Dust mites do not bite and do not cause harm to humans, other than by triggering allergies/asthma.  Ways to decrease your exposure to dust mites in your home:  1. Encase mattresses, box springs and pillows with a mite-impermeable barrier or cover  2. Wash sheets, blankets and drapes weekly in hot water (130 F) with detergent and dry them in a dryer on the hot setting.  3. Have the room cleaned frequently with a vacuum cleaner and a damp dust-mop. For carpeting or rugs, vacuuming with a vacuum cleaner equipped with a high-efficiency particulate air (HEPA) filter. The dust mite allergic individual should not be in a room which is being cleaned and should wait 1 hour after cleaning before going into the room.  4. Do not sleep on upholstered furniture (eg, couches).  5. If possible removing carpeting, upholstered furniture and drapery from the home is ideal. Horizontal blinds should be eliminated in the rooms where the person spends the most time (bedroom, study, television room). Washable vinyl, roller-type shades are optimal.  6. Remove all non-washable stuffed toys from the bedroom. Wash stuffed toys weekly like sheets and blankets above.  7. Reduce indoor humidity to less than 50%. Inexpensive humidity monitors can be purchased at most hardware stores. Do not use a humidifier as can make the problem worse and are not recommended.  Control  of Cockroach Allergen Cockroach allergen has been identified as an important cause of acute attacks of asthma, especially in urban settings.  There are fifty-five species of cockroach that exist in the [place], however only three, the [place], [place] species produce allergen that can affect patients with Asthma.  Allergens can be obtained from fecal particles, egg casings and secretions from cockroaches.    Remove food sources. Reduce access to water. Seal access and entry points. Spray runways with 0.5-1% Diazinon or Chlorpyrifos Blow boric acid power under stoves and refrigerator. Place bait stations (hydramethylnon) at feeding sites.

## 2023-07-10 ENCOUNTER — Other Ambulatory Visit: Payer: Self-pay

## 2023-07-10 ENCOUNTER — Encounter: Payer: Self-pay | Admitting: Family

## 2023-07-10 ENCOUNTER — Ambulatory Visit (INDEPENDENT_AMBULATORY_CARE_PROVIDER_SITE_OTHER): Payer: 59 | Admitting: Family

## 2023-07-10 ENCOUNTER — Ambulatory Visit: Payer: 59 | Admitting: Family

## 2023-07-10 ENCOUNTER — Telehealth: Payer: Self-pay

## 2023-07-10 VITALS — BP 132/88 | HR 88 | Temp 97.9°F | Resp 20 | Wt 224.4 lb

## 2023-07-10 DIAGNOSIS — K219 Gastro-esophageal reflux disease without esophagitis: Secondary | ICD-10-CM | POA: Diagnosis not present

## 2023-07-10 DIAGNOSIS — J4540 Moderate persistent asthma, uncomplicated: Secondary | ICD-10-CM

## 2023-07-10 DIAGNOSIS — J3089 Other allergic rhinitis: Secondary | ICD-10-CM

## 2023-07-10 DIAGNOSIS — J302 Other seasonal allergic rhinitis: Secondary | ICD-10-CM

## 2023-07-10 NOTE — Telephone Encounter (Signed)
Thank you :)

## 2023-07-10 NOTE — Progress Notes (Signed)
[address] [place] [address] Dept: [phone]  FOLLOW UP NOTE  Patient ID: Clifford Williamson, male    DOB: [birth year]  Age: 53 y.o. MRN: [id number] Date of Office Visit: 07/10/2023  Assessment  Chief Complaint: Follow-up (Doing well on trelogy)  HPI Clifford Williamson is a 52 year old male who presents today for follow-up of seasonal and perennial allergic rhinitis, not well-controlled moderate persistent asthma, and gastroesophageal reflux disease.  He was last seen by myself on May 11, 2023.  He denies any new diagnosis or surgeries since his last office visit.  Moderate persistent asthma: He reports that when he was using the sample of Trelegy 200 mcg 1 puff once a day that he did not have any asthma symptoms.  He is currently out of the sample of Trelegy.  He did not pick up the prescription for Trelegy and may have lost the coupon card for Trelegy.  He does continue to take montelukast 10 mg once a day and receives Nucala injections every 4 weeks.  He denies cough, wheeze, tightness in chest, shortness of breath, nocturnal awakenings due to breathing problems, fever, and chills.  Since his last office visit he has not required any systemic steroids or made any trips to the emergency room or urgent care due to breathing problems.  He reports that he does not use his albuterol inhaler often.  If he walks a lot he will use his albuterol inhaler.  He denies any problems or reactions with his Nucala injections.  Allergic rhinitis: He reports nasal congestion for almost a week.  He denies rhinorrhea and postnasal drip.  He has not been treated for any sinus infections since we last saw him.  He is currently using Flonase nasal spray and uses diphenhydramine as needed.  Reflux is reported as doing good with omeprazole 20 mg once a day.   Drug Allergies:  Allergies  Allergen Reactions   Azithromycin Itching and Rash    Rash/ itching   Nsaids Other (See Comments)    Other reaction(s):  ASTHMA Other reaction(s): ASTHMA Other reaction(s): ASTHMA   Tolmetin Other (See Comments)    Other reaction(s): ASTHMA Other reaction(s): ASTHMA   Naproxen Sodium     Review of Systems: Negative except as per HPI   Physical Exam: BP 132/88 (BP Location: Left Arm, Patient Position: Sitting, Cuff Size: Normal)   Pulse 88   Temp 97.9 F (36.6 C) (Temporal)   Resp 20   Wt 224 lb 6.4 oz (101.8 kg)   SpO2 99%   BMI 35.68 kg/m    Physical Exam Constitutional:      Appearance: Normal appearance.  HENT:     Head: Normocephalic and atraumatic.     Comments: Pharynx normal, eyes normal, ears normal, nose: Bilateral lower turbinates moderately edematous and pale with clear drainage noted.  Left turbinate greater than right turbinate    Right Ear: Tympanic membrane, ear canal and external ear normal.     Left Ear: Tympanic membrane, ear canal and external ear normal.     Mouth/Throat:     Mouth: Mucous membranes are moist.     Pharynx: Oropharynx is clear.  Eyes:     Conjunctiva/sclera: Conjunctivae normal.  Cardiovascular:     Rate and Rhythm: Regular rhythm.     Heart sounds: Normal heart sounds.  Pulmonary:     Effort: Pulmonary effort is normal.     Breath sounds: Normal breath sounds.     Comments: Lungs clear to  auscultation Musculoskeletal:     Cervical back: Neck supple.  Skin:    General: Skin is warm.  Neurological:     Mental Status: He is alert and oriented to person, place, and time.  Psychiatric:        Mood and Affect: Mood normal.        Behavior: Behavior normal.        Thought Content: Thought content normal.        Judgment: Judgment normal.     Diagnostics:  none  Assessment and Plan: 1. Moderate persistent asthma without complication   2. Seasonal and perennial allergic rhinitis   3. Gastroesophageal reflux disease, unspecified whether esophagitis present     No orders of the defined types were placed in this encounter.   Patient  Instructions  Moderate persistent asthma-controlled on samples of Trelegy -Continue Trelegy 200 mcg 1 puff once a day.  Rinse mouth out afterwards. Please bring the coupon card for Trelegy to help with the cost. Samples given along with another coupon card. Please let me know if this medication is still too expensive with the coupon card Continue montelukast 10 mg once a day to prevent cough, wheeze, and shortness of breath Continue albuterol 2 puffs every 4 hours as needed for cough, wheeze, and shortness of breath Continue albuterol 2 puffs 5 to 15 minutes before activity to decrease cough or wheeze Continue Nucala injections every 4 weeks  Asthma control goals:  Full participation in all desired activities (may need albuterol before activity) Albuterol use two time or less a week on average (not counting use with activity) Cough interfering with sleep two time or less a month Oral steroids no more than once a year No hospitalizations   Allergic rhinitis Continue Flonase nasal spray 2 sprays once a day to control stuffy nose. Try using this every day to help with nasal congestion. In the right nostril, point the applicator out toward the right ear. In the left nostril, point the applicator out toward the left ear Continue an antihistamine once a day for a runny nose or itch.Remember to rotate to a different antihistamine about every 3 months. Some examples of over the counter antihistamines include Zyrtec (cetirizine), Xyzal (levocetirizine), Allegra (fexofenadine), and Claritin (loratidine).  Samples of Allegra given. You may use nasal saline rinses as needed for nasal symptoms. Use this before using medicated nasal sprays Continue allergen avoidance measures directed toward pollens, dust mite, mold, pet, and cockroach as listed below  Reflux Continue omeprazole 20 mg once a day as previously prescribed Continue dietary and lifestyle modifications as listed below  Please let us know if  this treatment plan is not working for you  Schedule a follow up in 2-3 months or sooner as needed  Reducing Pollen Exposure The American Academy of Allergy, Asthma and Immunology suggests the following steps to reduce your exposure to pollen during allergy seasons. Do not hang sheets or clothing out to dry; pollen may collect on these items. Do not mow lawns or spend time around freshly cut grass; mowing stirs up pollen. Keep windows closed at night.  Keep car windows closed while driving. Minimize morning activities outdoors, a time when pollen counts are usually at their highest. Stay indoors as much as possible when pollen counts or humidity is high and on windy days when pollen tends to remain in the air longer. Use air conditioning when possible.  Many air conditioners have filters that trap the pollen spores. Use a HEPA room  air filter to remove pollen form the indoor air you breathe.  Control of Mold Allergen Mold and fungi can grow on a variety of surfaces provided certain temperature and moisture conditions exist.  Outdoor molds grow on plants, decaying vegetation and soil.  The major outdoor mold, Alternaria and Cladosporium, are found in very high numbers during hot and dry conditions.  Generally, a late Summer - Fall peak is seen for common outdoor fungal spores.  Rain will temporarily lower outdoor mold spore count, but counts rise rapidly when the rainy period ends.  The most important indoor molds are Aspergillus and Penicillium.  Dark, humid and poorly ventilated basements are ideal sites for mold growth.  The next most common sites of mold growth are the bathroom and the kitchen.  Outdoor [employer] Use air conditioning and keep windows closed Avoid exposure to decaying vegetation. Avoid leaf raking. Avoid grain handling. Consider wearing a face mask if working in moldy areas.  Indoor Mold Control Maintain humidity below 50%. Clean washable surfaces with 5% bleach  solution. Remove sources e.g. Contaminated carpets.   Control of Dust Mite Allergen Dust mites play a major role in allergic asthma and rhinitis. They occur in environments with high humidity wherever human skin is found. Dust mites absorb humidity from the atmosphere (ie, they do not drink) and feed on organic matter (including shed human and animal skin). Dust mites are a microscopic type of insect that you cannot see with the naked eye. High levels of dust mites have been detected from mattresses, pillows, carpets, upholstered furniture, bed covers, clothes, soft toys and any woven material. The principal allergen of the dust mite is found in its feces. A gram of dust may contain 1,000 mites and 250,000 fecal particles. Mite antigen is easily measured in the air during house cleaning activities. Dust mites do not bite and do not cause harm to humans, other than by triggering allergies/asthma.  Ways to decrease your exposure to dust mites in your home:  1. Encase mattresses, box springs and pillows with a mite-impermeable barrier or cover  2. Wash sheets, blankets and drapes weekly in hot water (130 F) with detergent and dry them in a dryer on the hot setting.  3. Have the room cleaned frequently with a vacuum cleaner and a damp dust-mop. For carpeting or rugs, vacuuming with a vacuum cleaner equipped with a high-efficiency particulate air (HEPA) filter. The dust mite allergic individual should not be in a room which is being cleaned and should wait 1 hour after cleaning before going into the room.  4. Do not sleep on upholstered furniture (eg, couches).  5. If possible removing carpeting, upholstered furniture and drapery from the home is ideal. Horizontal blinds should be eliminated in the rooms where the person spends the most time (bedroom, study, television room). Washable vinyl, roller-type shades are optimal.  6. Remove all non-washable stuffed toys from the bedroom. Wash stuffed toys  weekly like sheets and blankets above.  7. Reduce indoor humidity to less than 50%. Inexpensive humidity monitors can be purchased at most hardware stores. Do not use a humidifier as can make the problem worse and are not recommended.  Control of Cockroach Allergen Cockroach allergen has been identified as an important cause of acute attacks of asthma, especially in urban settings.  There are fifty-five species of cockroach that exist in the [place], however only three, the [place], [place] species produce allergen that can affect patients with Asthma.  Allergens  can be obtained from fecal particles, egg casings and secretions from cockroaches.    Remove food sources. Reduce access to water. Seal access and entry points. Spray runways with 0.5-1% Diazinon or Chlorpyrifos Blow boric acid power under stoves and refrigerator. Place bait stations (hydramethylnon) at feeding sites.  Return in about 3 months (around 10/08/2023), or if symptoms worsen or fail to improve.    Thank you for the opportunity to care for this patient.  Please do not hesitate to contact me with questions.  Nehemiah Settle, FNP [facility]

## 2023-07-10 NOTE — Telephone Encounter (Signed)
Called Clifford Williamson back at pharmacy back tried to obtain price for generic advair or spiriva inhaler she states she needs an e script not verbal and she can run and have a price for [place] in about an hour 2:15 or 2:30pm.

## 2023-07-10 NOTE — Patient Instructions (Addendum)
Moderate persistent asthma-controlled on samples of Trelegy -Continue Trelegy 200 mcg 1 puff once a day.  Rinse mouth out afterwards. Please bring the coupon card for Trelegy to help with the cost. Samples given along with another coupon card. Please let me know if this medication is still too expensive with the coupon card Continue montelukast 10 mg once a day to prevent cough, wheeze, and shortness of breath Continue albuterol 2 puffs every 4 hours as needed for cough, wheeze, and shortness of breath Continue albuterol 2 puffs 5 to 15 minutes before activity to decrease cough or wheeze Continue Nucala injections every 4 weeks  Asthma control goals:  Full participation in all desired activities (may need albuterol before activity) Albuterol use two time or less a week on average (not counting use with activity) Cough interfering with sleep two time or less a month Oral steroids no more than once a year No hospitalizations   Allergic rhinitis Continue Flonase nasal spray 2 sprays once a day to control stuffy nose. Try using this every day to help with nasal congestion. In the right nostril, point the applicator out toward the right ear. In the left nostril, point the applicator out toward the left ear Continue an antihistamine once a day for a runny nose or itch.Remember to rotate to a different antihistamine about every 3 months. Some examples of over the counter antihistamines include Zyrtec (cetirizine), Xyzal (levocetirizine), Allegra (fexofenadine), and Claritin (loratidine).  Samples of Allegra given. You may use nasal saline rinses as needed for nasal symptoms. Use this before using medicated nasal sprays Continue allergen avoidance measures directed toward pollens, dust mite, mold, pet, and cockroach as listed below  Reflux Continue omeprazole 20 mg once a day as previously prescribed Continue dietary and lifestyle modifications as listed below  Please let us know if this treatment  plan is not working for you  Schedule a follow up in 2-3 months or sooner as needed  Reducing Pollen Exposure The American Academy of Allergy, Asthma and Immunology suggests the following steps to reduce your exposure to pollen during allergy seasons. Do not hang sheets or clothing out to dry; pollen may collect on these items. Do not mow lawns or spend time around freshly cut grass; mowing stirs up pollen. Keep windows closed at night.  Keep car windows closed while driving. Minimize morning activities outdoors, a time when pollen counts are usually at their highest. Stay indoors as much as possible when pollen counts or humidity is high and on windy days when pollen tends to remain in the air longer. Use air conditioning when possible.  Many air conditioners have filters that trap the pollen spores. Use a HEPA room air filter to remove pollen form the indoor air you breathe.  Control of Mold Allergen Mold and fungi can grow on a variety of surfaces provided certain temperature and moisture conditions exist.  Outdoor molds grow on plants, decaying vegetation and soil.  The major outdoor mold, Alternaria and Cladosporium, are found in very high numbers during hot and dry conditions.  Generally, a late Summer - Fall peak is seen for common outdoor fungal spores.  Rain will temporarily lower outdoor mold spore count, but counts rise rapidly when the rainy period ends.  The most important indoor molds are Aspergillus and Penicillium.  Dark, humid and poorly ventilated basements are ideal sites for mold growth.  The next most common sites of mold growth are the bathroom and the kitchen.  Outdoor [employer] Use air  conditioning and keep windows closed Avoid exposure to decaying vegetation. Avoid leaf raking. Avoid grain handling. Consider wearing a face mask if working in moldy areas.  Indoor Mold Control Maintain humidity below 50%. Clean washable surfaces with 5% bleach solution. Remove  sources e.g. Contaminated carpets.   Control of Dust Mite Allergen Dust mites play a major role in allergic asthma and rhinitis. They occur in environments with high humidity wherever human skin is found. Dust mites absorb humidity from the atmosphere (ie, they do not drink) and feed on organic matter (including shed human and animal skin). Dust mites are a microscopic type of insect that you cannot see with the naked eye. High levels of dust mites have been detected from mattresses, pillows, carpets, upholstered furniture, bed covers, clothes, soft toys and any woven material. The principal allergen of the dust mite is found in its feces. A gram of dust may contain 1,000 mites and 250,000 fecal particles. Mite antigen is easily measured in the air during house cleaning activities. Dust mites do not bite and do not cause harm to humans, other than by triggering allergies/asthma.  Ways to decrease your exposure to dust mites in your home:  1. Encase mattresses, box springs and pillows with a mite-impermeable barrier or cover  2. Wash sheets, blankets and drapes weekly in hot water (130 F) with detergent and dry them in a dryer on the hot setting.  3. Have the room cleaned frequently with a vacuum cleaner and a damp dust-mop. For carpeting or rugs, vacuuming with a vacuum cleaner equipped with a high-efficiency particulate air (HEPA) filter. The dust mite allergic individual should not be in a room which is being cleaned and should wait 1 hour after cleaning before going into the room.  4. Do not sleep on upholstered furniture (eg, couches).  5. If possible removing carpeting, upholstered furniture and drapery from the home is ideal. Horizontal blinds should be eliminated in the rooms where the person spends the most time (bedroom, study, television room). Washable vinyl, roller-type shades are optimal.  6. Remove all non-washable stuffed toys from the bedroom. Wash stuffed toys weekly like sheets  and blankets above.  7. Reduce indoor humidity to less than 50%. Inexpensive humidity monitors can be purchased at most hardware stores. Do not use a humidifier as can make the problem worse and are not recommended.  Control of Cockroach Allergen Cockroach allergen has been identified as an important cause of acute attacks of asthma, especially in urban settings.  There are fifty-five species of cockroach that exist in the [place], however only three, the [place], [place] species produce allergen that can affect patients with Asthma.  Allergens can be obtained from fecal particles, egg casings and secretions from cockroaches.    Remove food sources. Reduce access to water. Seal access and entry points. Spray runways with 0.5-1% Diazinon or Chlorpyrifos Blow boric acid power under stoves and refrigerator. Place bait stations (hydramethylnon) at feeding sites.

## 2023-07-10 NOTE — Telephone Encounter (Signed)
Called [facility] in [place] [phone] spoke to Burna Mortimer his trelogy is $[phone] after Monia Pouch [phone]

## 2023-08-10 ENCOUNTER — Ambulatory Visit (INDEPENDENT_AMBULATORY_CARE_PROVIDER_SITE_OTHER): Payer: 59

## 2023-08-10 DIAGNOSIS — J4550 Severe persistent asthma, uncomplicated: Secondary | ICD-10-CM

## 2023-08-17 ENCOUNTER — Emergency Department (HOSPITAL_BASED_OUTPATIENT_CLINIC_OR_DEPARTMENT_OTHER)
Admission: EM | Admit: 2023-08-17 | Discharge: 2023-08-17 | Disposition: A | Discharge status: Home / Self Care | Payer: 59 | Attending: Emergency Medicine | Admitting: Emergency Medicine

## 2023-08-17 ENCOUNTER — Other Ambulatory Visit (HOSPITAL_BASED_OUTPATIENT_CLINIC_OR_DEPARTMENT_OTHER): Payer: Self-pay

## 2023-08-17 ENCOUNTER — Emergency Department (HOSPITAL_BASED_OUTPATIENT_CLINIC_OR_DEPARTMENT_OTHER): Payer: 59

## 2023-08-17 DIAGNOSIS — I10 Essential (primary) hypertension: Secondary | ICD-10-CM | POA: Diagnosis not present

## 2023-08-17 DIAGNOSIS — Z20822 Contact with and (suspected) exposure to covid-19: Secondary | ICD-10-CM | POA: Insufficient documentation

## 2023-08-17 DIAGNOSIS — R1032 Left lower quadrant pain: Secondary | ICD-10-CM | POA: Diagnosis present

## 2023-08-17 DIAGNOSIS — Z7951 Long term (current) use of inhaled steroids: Secondary | ICD-10-CM | POA: Diagnosis not present

## 2023-08-17 DIAGNOSIS — Z79899 Other long term (current) drug therapy: Secondary | ICD-10-CM | POA: Diagnosis not present

## 2023-08-17 DIAGNOSIS — J45909 Unspecified asthma, uncomplicated: Secondary | ICD-10-CM | POA: Insufficient documentation

## 2023-08-17 DIAGNOSIS — E119 Type 2 diabetes mellitus without complications: Secondary | ICD-10-CM | POA: Diagnosis not present

## 2023-08-17 DIAGNOSIS — R079 Chest pain, unspecified: Secondary | ICD-10-CM | POA: Diagnosis not present

## 2023-08-17 DIAGNOSIS — M25552 Pain in left hip: Secondary | ICD-10-CM | POA: Insufficient documentation

## 2023-08-17 LAB — BASIC METABOLIC PANEL
Anion gap: 11 (ref 5–15)
BUN: 16 mg/dL (ref 6–20)
CO2: 23 mmol/L (ref 22–32)
Calcium: 9.2 mg/dL (ref 8.9–10.3)
Chloride: 107 mmol/L (ref 98–111)
Creatinine, Ser: 1.21 mg/dL (ref 0.61–1.24)
GFR, Estimated: >60 mL/min (ref >60)
Glucose, Bld: 127 mg/dL — ABNORMAL HIGH (ref 70–99)
Potassium: 3.7 mmol/L (ref 3.5–5.1)
Sodium: 141 mmol/L (ref 135–145)

## 2023-08-17 LAB — CBC
HCT: 41.6 % (ref 39.0–52.0)
Hemoglobin: 14.1 g/dL (ref 13.0–17.0)
MCH: 28.8 pg (ref 26.0–34.0)
MCHC: 33.9 g/dL (ref 30.0–36.0)
MCV: 84.9 fL (ref 80.0–100.0)
Platelets: 193 10*3/uL (ref 150–400)
RBC: 4.9 MIL/uL (ref 4.22–5.81)
RDW: 12.6 % (ref 11.5–15.5)
WBC: 6.5 10*3/uL (ref 4.0–10.5)
nRBC: 0 % (ref 0.0–0.2)

## 2023-08-17 LAB — RESP PANEL BY RT-PCR (RSV, FLU A&B, COVID)  RVPGX2
Influenza A by PCR: NEGATIVE
Influenza B by PCR: NEGATIVE
Resp Syncytial Virus by PCR: NEGATIVE
SARS Coronavirus 2 by RT PCR: NEGATIVE

## 2023-08-17 LAB — TROPONIN I (HIGH SENSITIVITY): Troponin I (High Sensitivity): 7 ng/L (ref <18)

## 2023-08-17 MED ORDER — OXYCODONE HCL 5 MG PO TABS
5.0000 mg | ORAL_TABLET | Freq: Four times a day (QID) | ORAL | 0 refills | Status: AC | PRN
  Filled 2023-08-17: qty 9, 3d supply, fill #0

## 2023-08-17 MED ORDER — CYCLOBENZAPRINE HCL 10 MG PO TABS
10.0000 mg | ORAL_TABLET | Freq: Once | ORAL | Status: AC
  Administered 2023-08-17: 10 mg via ORAL
  Filled 2023-08-17: qty 1

## 2023-08-17 MED ORDER — LIDOCAINE 5 % EX PTCH
1.0000 | MEDICATED_PATCH | Freq: Once | CUTANEOUS | Status: DC
  Administered 2023-08-17: 1 via TRANSDERMAL
  Filled 2023-08-17: qty 1

## 2023-08-17 MED ORDER — LIDOCAINE 5 % EX PTCH
1.0000 | MEDICATED_PATCH | CUTANEOUS | 0 refills | Status: AC
  Filled 2023-08-17: qty 30, 30d supply, fill #0

## 2023-08-17 MED ORDER — ACETAMINOPHEN 500 MG PO TABS
1000.0000 mg | ORAL_TABLET | Freq: Once | ORAL | Status: AC
  Administered 2023-08-17: 1000 mg via ORAL
  Filled 2023-08-17: qty 2

## 2023-08-17 MED ORDER — OXYCODONE HCL 5 MG PO TABS
5.0000 mg | ORAL_TABLET | Freq: Once | ORAL | Status: AC
  Administered 2023-08-17: 5 mg via ORAL
  Filled 2023-08-17: qty 1

## 2023-08-17 MED ORDER — PREDNISONE 5 MG (21) PO TBPK: ORAL_TABLET | ORAL | 0 refills | Status: AC

## 2023-08-17 MED ORDER — CYCLOBENZAPRINE HCL 10 MG PO TABS
10.0000 mg | ORAL_TABLET | Freq: Two times a day (BID) | ORAL | 0 refills | Status: AC | PRN
Start: 2023-08-17 — End: ?
  Filled 2023-08-17: qty 20, 10d supply, fill #0

## 2023-08-17 MED ORDER — PREDNISONE 50 MG PO TABS
60.0000 mg | ORAL_TABLET | Freq: Once | ORAL | Status: AC
  Administered 2023-08-17: 60 mg via ORAL
  Filled 2023-08-17: qty 1

## 2023-08-17 NOTE — ED Triage Notes (Signed)
 Pt reports left hip/groin pain that began last night and worsened while he was at work today.  Pt had not taken any pain medication prior to EMS arrival.

## 2023-08-17 NOTE — ED Triage Notes (Signed)
 Upon completion of initial triage assessment, pt reports to RN that he has been having left side chest pain that has been coming and going for a few days.

## 2023-08-17 NOTE — Discharge Instructions (Addendum)
 You were seen in the emergency department for your hip pain.  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  You can take Tylenol  every 6 hours as needed for pain as well as use the lidocaine  patches, ice or heat.  I have given you a steroid taper and you should complete this as prescribed.  You can take Flexeril  or oxycodone  as needed for breakthrough pain.  Flexeril  is a muscle relaxer and oxycodone  is a narcotic.  You should not take these together and space them apart as they can both make you drowsy and do not take them while working, driving or operating heavy machinery.  You can follow-up with your primary doctor or with your orthopedic doctor to have your symptoms rechecked.  You should return to the emergency department for significantly worsening pain and you are unable to walk, you have developed redness or swelling of your hip, you have fevers or if you have any other new or concerning symptoms.

## 2023-08-17 NOTE — ED Notes (Signed)
 RT assessed pt for decrease in saturation on monitor. Pt probe changed and sats w/in acceptable range. Pt respiratory status stable on RA w/no distress noted at this time. Pt BLBS dim throughout. Pt w/hx of OSA and asthma. Pt not complaining of any respiratory issues at this time.     08/17/23 1120  Vitals  Pulse Rate 64  Pulse Rate Source Monitor  ECG Heart Rate 68  Resp 15  SpO2 98 %  Oxygen Therapy  O2 Device Room Air  Patient Activity (if Appropriate) In bed  Pulse Oximetry Type Continuous

## 2023-08-17 NOTE — ED Notes (Signed)

## 2023-08-17 NOTE — ED Notes (Signed)
 Per EMS, pt called from his place of work due to a flare up of Left hip/groin pain that was preventing him from any weight bearing.  Pt was in tears upon EMS arrival, given of fentanyl en route.  22g IV in LH.   170/104, HR 78, SpO2 98%, Resp 18.

## 2023-08-17 NOTE — ED Provider Notes (Signed)
 [place] EMERGENCY DEPARTMENT AT [facility] Provider Note   CSN: [id number] Arrival date & time: 08/17/23  1026     History  Chief Complaint  Patient presents with   Hip Pain   Chest Pain    Clifford Williamson is a 53 y.o. male.  Patient is a 53 year old male with a past medical history of asthma, hypertension, diabetes presenting to the emergency department with left groin pain as well as chest pain.  The patient states that yesterday he had some pain in his left groin and when he woke up this morning the pain was worse.  States the pain is worse with movement and with ambulation he is still able to walk.  He denies any trauma or falls or recent heavy lifting or repetitive activity.  He denies any bulges in his groin.  Denies any testicular or scrotal pain.  He states yesterday afternoon he had an episode of left-sided chest pain that has been coming and going.  Currently his chest pain-free.  Denies any associated shortness of breath.  Has had some recent congestion that he has been taking allergy medication for.  States he has not needed to use his inhaler more often recently.  Denies any cough.  The history is provided by the patient.  Hip Pain Associated symptoms include chest pain.  Chest Pain      Home Medications Prior to Admission medications   Medication Sig Start Date End Date Taking? Authorizing Provider  cyclobenzaprine  (FLEXERIL ) 10 MG tablet Take 1 tablet (10 mg total) by mouth 2 (two) times daily as needed for muscle spasms. 08/17/23  Yes Ellouise, Kamarri Lovvorn K, DO  lidocaine  (LIDODERM ) 5 % Place 1 patch onto the skin daily. Remove & Discard patch within 12 hours or as directed by MD 08/17/23  Yes Ellouise, Kush Farabee K, DO  oxyCODONE  (ROXICODONE ) 5 MG immediate release tablet Take 1 tablet (5 mg total) by mouth every 6 (six) hours as needed. 08/17/23  Yes Ellouise, Tarita Deshmukh K, DO  predniSONE  (STERAPRED UNI-PAK 21 TAB) 5 MG (21) TBPK tablet Take 6 tabs day 1, then 5 tabs  day 2, then 4 tabs day 3, then 3 tabs day 4, then 2 tabs day 5 and 1 tab day 6 08/17/23 08/23/23 Yes Ellouise, Aundre Hietala K, DO  albuterol  (PROVENTIL ) (2.5 MG/3ML) 0.083% nebulizer solution Take 2.5 mg by nebulization every 6 (six) hours as needed for wheezing.    [provider]  albuterol  (VENTOLIN  HFA) 108 (90 Base) MCG/ACT inhaler INHALE 2 PUFFS INTO THE LUNGS EVERY 4 HOURS AS NEEDED FOR WHEEZING OR SHORTNESS OF BREATH 08/24/22   Ambs, Arlean HERO, FNP  azelastine  (ASTELIN ) 0.1 % nasal spray Place 2 sprays into both nostrils 2 (two) times daily as needed for rhinitis. Use in each nostril as directed 11/03/22   Marinda Rocky SAILOR, MD  cetirizine  (ZYRTEC ) 10 MG tablet Take 1 tablet (10 mg total) by mouth daily as needed for allergies. 02/12/19   Cari Arlean HERO, FNP  diphenhydrAMINE (BENADRYL) 25 MG tablet Take 25 mg by mouth every 8 (eight) hours as needed for itching.    [provider]  EPINEPHrine  (AUVI-Q ) 0.3 mg/0.3 mL IJ SOAJ injection Use as directed for severe allergic reaction 10/14/21   Ambs, Arlean HERO, FNP  fluticasone  (FLONASE ) 50 MCG/ACT nasal spray Place 2 sprays into both nostrils daily. 04/15/21   Cari Arlean HERO, FNP  Fluticasone -Umeclidin-Vilant (TRELEGY ELLIPTA ) 200-62.5-25 MCG/ACT AEPB Inhale 1 puff once a day to help prevent cough and wheeze.  Rinse mouth out afterwards. 05/11/23   Cheryl Reusing, FNP  hydrochlorothiazide (MICROZIDE) 12.5 MG capsule Take by mouth. 04/08/21   [provider]  methocarbamol (ROBAXIN) 500 MG tablet Take 500 mg by mouth as needed.  06/13/16   [provider]  montelukast  (SINGULAIR ) 10 MG tablet Take 1 tablet once a day to prevent cough or wheeze 04/15/21   Ambs, Arlean HERO, FNP  omalizumab  (XOLAIR ) 150 MG/ML prefilled syringe Inject 375 mg into the skin every 14 (fourteen) days. Patient not taking: Reported on 11/03/2022 09/02/21   Cari Arlean HERO, FNP  omeprazole  (PRILOSEC) 20 MG capsule Take 1 capsule by mouth daily. 02/23/21   [provider]   phentermine 37.5 MG capsule Take by mouth. 12/16/20   [provider]  traMADol (ULTRAM) 50 MG tablet Take 50 mg by mouth every 8 (eight) hours as needed. 08/10/22   [provider]  zolpidem (AMBIEN CR) 12.5 MG CR tablet Take 12.5 mg by mouth. 09/05/16   [provider]      Allergies    Azithromycin , Nsaids, Tolmetin, and Naproxen sodium    Review of Systems   Review of Systems  Cardiovascular:  Positive for chest pain.    Physical Exam Updated Vital Signs BP (!) 145/92 (BP Location: Right Arm)   Pulse 73   Temp 97.9 F (36.6 C) (Temporal)   Resp 15   SpO2 100%  Physical Exam Vitals and nursing note reviewed.  Constitutional:      Appearance: He is well-developed. He is not ill-appearing.     Comments: Uncomfortable appearing  HENT:     Head: Normocephalic and atraumatic.  Eyes:     Pupils: Pupils are equal, round, and reactive to light.  Cardiovascular:     Rate and Rhythm: Normal rate and regular rhythm.     Heart sounds: Normal heart sounds.  Pulmonary:     Effort: Pulmonary effort is normal.     Breath sounds: Normal breath sounds.  Abdominal:     Palpations: Abdomen is soft.     Tenderness: There is no abdominal tenderness.     Comments: No palpable inguinal or femoral hernia  Musculoskeletal:        General: Normal range of motion.     Cervical back: Normal range of motion.     Right lower leg: No edema.     Left lower leg: Tenderness (L medial thigh) present. No edema.     Comments: No pain with log roll, LE ROM intact  Neurological:     General: No focal deficit present.     Mental Status: He is alert and oriented to person, place, and time.     Motor: No weakness.  Psychiatric:        Mood and Affect: Mood normal.        Behavior: Behavior normal.     ED Results / Procedures / Treatments   Labs (all labs ordered are listed, but only abnormal results are displayed) Labs Reviewed  BASIC METABOLIC PANEL - Abnormal; Notable  for the following components:      Result Value   Glucose, Bld 127 (*)    All other components within normal limits  RESP PANEL BY RT-PCR (RSV, FLU A&B, COVID)  RVPGX2  CBC  TROPONIN I (HIGH SENSITIVITY)    EKG EKG Interpretation Date/Time:  Thursday August 17 2023 10:44:39 EST Ventricular Rate:  77 PR Interval:  169 QRS Duration:  89 QT Interval:  385 QTC Calculation: 436  R Axis:   15  Text Interpretation: Sinus rhythm No significant change since last tracing Confirmed by Ellouise Fine ([id number]) on 08/17/2023 11:01:39 AM  Radiology DG Hip Unilat With Pelvis 2-3 Views Left Result Date: 08/17/2023 CLINICAL DATA:  53 year old male with progressive left hip/groin pain. Difficulty weight-bearing. EXAM: DG HIP (WITH OR WITHOUT PELVIS) 2-3V LEFT COMPARISON:  Pelvis and left hip series 04/26/2022. FINDINGS: Bone mineralization remains within normal limits. Femoral heads remain normally located. Pelvis appears stable and intact. SI joints remain normal. Proximal right femur grossly intact. 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 But no overt acetabular fracture, and the proximal left femur appears intact, normal. Negative bowel gas. Chronic pelvic phleboliths. 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 No other acute osseous abnormality identified. Electronically Signed   By: VEAR Hurst M.D.   On: 08/17/2023 11:40   DG Chest Port 1 View Result Date: 08/17/2023 CLINICAL DATA:  53 year old male with progressive left hip/groin pain. Difficulty weight-bearing. EXAM: PORTABLE CHEST 1 VIEW COMPARISON:  Chest radiographs 01/07/2023. FINDINGS: Portable AP semi upright view at 1129 hours. Lordotic positioning. Lung volumes and mediastinal contours remain within normal limits. Visualized tracheal air column is within normal limits. Allowing for portable technique  the lungs are clear. Negative visible bowel gas. No acute osseous abnormality identified. IMPRESSION: Negative portable chest. Electronically Signed   By: VEAR Hurst M.D.   On: 08/17/2023 11:38    Procedures Procedures    Medications Ordered in ED Medications  lidocaine  (LIDODERM ) 5 % 1-3 patch (1 patch Transdermal Patch Applied 08/17/23 1142)  acetaminophen  (TYLENOL ) tablet 1,000 mg (1,000 mg Oral Given 08/17/23 1141)  cyclobenzaprine  (FLEXERIL ) tablet 10 mg (10 mg Oral Given 08/17/23 1130)  oxyCODONE  (Oxy IR/ROXICODONE ) immediate release tablet 5 mg (5 mg Oral Given 08/17/23 1308)  predniSONE  (DELTASONE ) tablet 60 mg (60 mg Oral Given 08/17/23 1308)    ED Course/ Medical Decision Making/ A&P Clinical Course as of 08/17/23 1310  Thu Aug 17, 2023  1152 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  [VK]  1219 Initial troponin negative. Chest pain ongoing since yesterday so single troponin is sufficient. [VK]  1255 Upon reassessment, the patient had mild improvement of pain but when trying to stand his pain acutely worsened.  He is unable to take NSAIDs and will be given a dose of oxycodone  and prednisone  plan for Medrol  dose pack.  He states that he is not a diabetic and glucose is normal here.  Patient was recommended follow-up with his orthopedic physician. [VK]    Clinical Course User Index [VK] Kingsley, Marisha Renier K, DO                                 Medical Decision Making This patient presents to the ED with chief complaint(s) of chest pain, L hip pain with pertinent past medical history of asthma, hypertension, diabetes which further complicates the presenting complaint. The complaint involves an extensive differential diagnosis and also carries with it a high risk of complications and morbidity.    The differential diagnosis includes hip fracture, muscle strain or spasm, no palpable hernia, no evidence of neurovascular injury, ACS, arrhythmia, anemia, pneumonia, pneumothorax, pleural  edema, pleural effusion, viral syndrome, no wheezing on exam making asthma exacerbation unlikely  Additional history obtained: Additional history obtained from N/A Records reviewed  Care Everywhere/External Records, Primary Care Documents, and outpatient orthopedics records - hx of trochanteric bursitis on the L hip  ED Course and Reassessment: On patient's arrival he is hemodynamically stable, uncomfortable appearing but not in acute distress.  Patient does have muscular tenderness to the left hip and is very uncomfortable appearing, no palpable hernia.  Will have hip x-ray performed.  Patient's episode of chest pain and ACS risk factors, will have EKG and labs including troponin.  EKG on arrival showed normal sinus rhythm without acute ischemic changes.  Patient will be given pain control and will be closely reassessed.  Independent labs interpretation:  The following labs were independently interpreted: within normal range  Independent visualization of imaging: - I independently visualized the following imaging with scope of interpretation limited to determining acute life threatening conditions related to emergency care: CXR, L hip XR, which revealed no acute disease  Consultation: - Consulted or discussed management/test interpretation w/ external professional: N/A  Consideration for admission or further workup: Patient has no emergent conditions requiring admission or further work-up at this time and is stable for discharge home with primary care and orthopedics follow-up  Social Determinants of health: N/A    Amount and/or Complexity of Data Reviewed Labs: ordered. Radiology: ordered.  Risk OTC drugs. Prescription drug management.          Final Clinical Impression(s) / ED Diagnoses Final diagnoses:  Left groin pain    Rx / DC Orders ED Discharge Orders          Ordered    predniSONE  (STERAPRED UNI-PAK 21 TAB) 5 MG (21) TBPK tablet        08/17/23 1308     lidocaine  (LIDODERM ) 5 %  Every 24 hours        08/17/23 1308    oxyCODONE  (ROXICODONE ) 5 MG immediate release tablet  Every 6 hours PRN        08/17/23 1308    cyclobenzaprine  (FLEXERIL ) 10 MG tablet  2 times daily PRN        08/17/23 1309              Kingsley, Tyann Niehaus K, DO 08/17/23 1311

## 2023-09-06 ENCOUNTER — Ambulatory Visit (INDEPENDENT_AMBULATORY_CARE_PROVIDER_SITE_OTHER): Payer: 59

## 2023-09-06 DIAGNOSIS — J4550 Severe persistent asthma, uncomplicated: Secondary | ICD-10-CM | POA: Diagnosis not present

## 2023-10-05 ENCOUNTER — Ambulatory Visit (INDEPENDENT_AMBULATORY_CARE_PROVIDER_SITE_OTHER): Payer: 59

## 2023-10-05 DIAGNOSIS — J4550 Severe persistent asthma, uncomplicated: Secondary | ICD-10-CM

## 2023-10-09 ENCOUNTER — Ambulatory Visit: Payer: 59 | Admitting: Family

## 2023-10-15 NOTE — Patient Instructions (Incomplete)
 Moderate persistent asthma-controlled on samples of Trelegy -Continue Trelegy 200 mcg 1 puff once a day.  Rinse mouth out afterwards.  Continue montelukast 10 mg once a day to prevent cough, wheeze, and shortness of breath Continue albuterol 2 puffs every 4 hours as needed for cough, wheeze, and shortness of breath Continue albuterol 2 puffs 5 to 15 minutes before activity to decrease cough or wheeze Continue Nucala injections every 4 weeks. Consider switching to Dupixent due to nasal polyp  Asthma control goals:  Full participation in all desired activities (may need albuterol before activity) Albuterol use two time or less a week on average (not counting use with activity) Cough interfering with sleep two time or less a month Oral steroids no more than once a year No hospitalizations   Allergic rhinitis with nasal polyp Stop Flonase  Start saline rinse with budesonide twice a day to help with stuffy nose/nasal congestion Fill the NeilMed bottle with 240 cc saline using distilled water and salt mixture.  Then add the entire 2cc vial of budesonide to the rinse bottle and mix together. Demonstration given. Continue to consider ENT referral Continue an antihistamine once a day for a runny nose or itch.Remember to rotate to a different antihistamine about every 3 months. Some examples of over the counter antihistamines include Zyrtec (cetirizine), Xyzal (levocetirizine), Allegra (fexofenadine), and Claritin (loratidine).   You may use nasal saline rinses as needed for nasal symptoms. Use this before using medicated nasal sprays Continue allergen avoidance measures directed toward pollens, dust mite, mold, pet, and cockroach as listed below  Reflux Continue omeprazole 20 mg once a day as previously prescribed Continue dietary and lifestyle modifications as listed below  Please let us know if this treatment plan is not working for you  Schedule a follow up in 2 months with Dr. Maurine Minister or  sooner as needed  Reducing Pollen Exposure The American Academy of Allergy, Asthma and Immunology suggests the following steps to reduce your exposure to pollen during allergy seasons. Do not hang sheets or clothing out to dry; pollen may collect on these items. Do not mow lawns or spend time around freshly cut grass; mowing stirs up pollen. Keep windows closed at night.  Keep car windows closed while driving. Minimize morning activities outdoors, a time when pollen counts are usually at their highest. Stay indoors as much as possible when pollen counts or humidity is high and on windy days when pollen tends to remain in the air longer. Use air conditioning when possible.  Many air conditioners have filters that trap the pollen spores. Use a HEPA room air filter to remove pollen form the indoor air you breathe.  Control of Mold Allergen Mold and fungi can grow on a variety of surfaces provided certain temperature and moisture conditions exist.  Outdoor molds grow on plants, decaying vegetation and soil.  The major outdoor mold, Alternaria and Cladosporium, are found in very high numbers during hot and dry conditions.  Generally, a late Summer - Fall peak is seen for common outdoor fungal spores.  Rain will temporarily lower outdoor mold spore count, but counts rise rapidly when the rainy period ends.  The most important indoor molds are Aspergillus and Penicillium.  Dark, humid and poorly ventilated basements are ideal sites for mold growth.  The next most common sites of mold growth are the bathroom and the kitchen.  Outdoor [employer] Use air conditioning and keep windows closed Avoid exposure to decaying vegetation. Avoid leaf raking. Avoid grain  handling. Consider wearing a face mask if working in moldy areas.  Indoor Mold Control Maintain humidity below 50%. Clean washable surfaces with 5% bleach solution. Remove sources e.g. Contaminated carpets.   Control of Dust Mite  Allergen Dust mites play a major role in allergic asthma and rhinitis. They occur in environments with high humidity wherever human skin is found. Dust mites absorb humidity from the atmosphere (ie, they do not drink) and feed on organic matter (including shed human and animal skin). Dust mites are a microscopic type of insect that you cannot see with the naked eye. High levels of dust mites have been detected from mattresses, pillows, carpets, upholstered furniture, bed covers, clothes, soft toys and any woven material. The principal allergen of the dust mite is found in its feces. A gram of dust may contain 1,000 mites and 250,000 fecal particles. Mite antigen is easily measured in the air during house cleaning activities. Dust mites do not bite and do not cause harm to humans, other than by triggering allergies/asthma.  Ways to decrease your exposure to dust mites in your home:  1. Encase mattresses, box springs and pillows with a mite-impermeable barrier or cover  2. Wash sheets, blankets and drapes weekly in hot water (130 F) with detergent and dry them in a dryer on the hot setting.  3. Have the room cleaned frequently with a vacuum cleaner and a damp dust-mop. For carpeting or rugs, vacuuming with a vacuum cleaner equipped with a high-efficiency particulate air (HEPA) filter. The dust mite allergic individual should not be in a room which is being cleaned and should wait 1 hour after cleaning before going into the room.  4. Do not sleep on upholstered furniture (eg, couches).  5. If possible removing carpeting, upholstered furniture and drapery from the home is ideal. Horizontal blinds should be eliminated in the rooms where the person spends the most time (bedroom, study, television room). Washable vinyl, roller-type shades are optimal.  6. Remove all non-washable stuffed toys from the bedroom. Wash stuffed toys weekly like sheets and blankets above.  7. Reduce indoor humidity to less than  50%. Inexpensive humidity monitors can be purchased at most hardware stores. Do not use a humidifier as can make the problem worse and are not recommended.  Control of Cockroach Allergen Cockroach allergen has been identified as an important cause of acute attacks of asthma, especially in urban settings.  There are fifty-five species of cockroach that exist in the [place], however only three, the [place], [place] species produce allergen that can affect patients with Asthma.  Allergens can be obtained from fecal particles, egg casings and secretions from cockroaches.    Remove food sources. Reduce access to water. Seal access and entry points. Spray runways with 0.5-1% Diazinon or Chlorpyrifos Blow boric acid power under stoves and refrigerator. Place bait stations (hydramethylnon) at feeding sites.

## 2023-10-16 ENCOUNTER — Ambulatory Visit (INDEPENDENT_AMBULATORY_CARE_PROVIDER_SITE_OTHER): Admitting: Family

## 2023-10-16 ENCOUNTER — Encounter: Payer: Self-pay | Admitting: Family

## 2023-10-16 VITALS — BP 120/80 | HR 88 | Temp 97.2°F | Resp 18

## 2023-10-16 DIAGNOSIS — J4540 Moderate persistent asthma, uncomplicated: Secondary | ICD-10-CM

## 2023-10-16 DIAGNOSIS — J330 Polyp of nasal cavity: Secondary | ICD-10-CM

## 2023-10-16 DIAGNOSIS — K219 Gastro-esophageal reflux disease without esophagitis: Secondary | ICD-10-CM | POA: Diagnosis not present

## 2023-10-16 DIAGNOSIS — J3089 Other allergic rhinitis: Secondary | ICD-10-CM | POA: Diagnosis not present

## 2023-10-16 DIAGNOSIS — J302 Other seasonal allergic rhinitis: Secondary | ICD-10-CM

## 2023-10-16 MED ORDER — BUDESONIDE 0.5 MG/2ML IN SUSP: RESPIRATORY_TRACT | 2 refills | Status: AC

## 2023-10-16 NOTE — Progress Notes (Signed)
 [address] [place] [address] Dept: [phone]  FOLLOW UP NOTE  Patient ID: Clifford Williamson, male    DOB: 1970-10-07  Age: 53 y.o. MRN: [id number] Date of Office Visit: 10/16/2023  Assessment  Chief Complaint: Asthma  HPI Clifford Williamson is a 53 year old male who presents today for follow-up of seasonal and perennial allergic rhinitis, not well-controlled moderate persistent asthma, and gastroesophageal reflux disease.  He was last seen by myself on May 11, 2023.  He denies any new diagnosis or surgery since his last office visit.  Moderate persistent asthma: He continues to take Trelegy 200 mcg 1 puff once a day. Trelegy has helped his breathing. He mentions that he still has samples and did not ever pick up the prescription that was sent at the last office visit.  He is requesting another coupon for Trelegy.  He also takes montelukast 10 mg once a day and Nucala injections every 4 weeks.  He denies any problems or reactions with his Nucala injections.  He has not had to use his albuterol inhaler since we last saw him.  He denies cough, wheeze, tightness in chest, shortness of breath, and nocturnal awakenings due to breathing problems.  Since his last office visit he has not required any systemic steroids or made any trips to the emergency room or urgent care due to breathing problems.  Allergic rhinitis: He reports a little bit of nasal congestion and denies rhinorrhea and postnasal drip.  He reports right now that he can taste, but he cannot smell.  He can only smell when he is cleared up.  He mentions that years ago he has seen ENT and he did not want to do sinus surgery. He cannot remember which ENT he saw.  He also reports that he has done allergy injections in the past, but had to stop due to insurance not covering them.  He does take Allegra and this helps a little and he will use fluticasone nasal spray every day.  He has not been using saline rinses and would like to try this  before a referral to ENT.  Reflux: He reports that he does not have any reflux symptoms as long as he takes omeprazole 20 mg once a day.   Drug Allergies:  Allergies  Allergen Reactions   Azithromycin Itching and Rash    Rash/ itching   Nsaids Other (See Comments)    Other reaction(s): ASTHMA Other reaction(s): ASTHMA Other reaction(s): ASTHMA   Tolmetin Other (See Comments)    Other reaction(s): ASTHMA Other reaction(s): ASTHMA   Naproxen Sodium     Review of Systems: Negative except as per HPI   Physical Exam: BP 120/80   Pulse 88   Temp (!) 97.2 F (36.2 C) (Temporal)   Resp 18   SpO2 100%    Physical Exam Constitutional:      Appearance: Normal appearance.  HENT:     Head: Normocephalic and atraumatic.     Comments: Pharynx normal, eyes normal, ears normal, nose: Polyp noted in right nostril.  Left nostril bilateral lower turbinates moderately edematous and pale with clear drainage noted    Right Ear: Tympanic membrane, ear canal and external ear normal.     Left Ear: Tympanic membrane, ear canal and external ear normal.     Mouth/Throat:     Mouth: Mucous membranes are moist.     Pharynx: Oropharynx is clear.  Eyes:     Conjunctiva/sclera: Conjunctivae normal.  Cardiovascular:  Rate and Rhythm: Regular rhythm.     Heart sounds: Normal heart sounds.  Pulmonary:     Effort: Pulmonary effort is normal.     Breath sounds: Normal breath sounds.     Comments: Lungs clear to auscultation Musculoskeletal:     Cervical back: Neck supple.  Skin:    General: Skin is warm.  Neurological:     Mental Status: He is alert and oriented to person, place, and time.  Psychiatric:        Mood and Affect: Mood normal.        Behavior: Behavior normal.        Thought Content: Thought content normal.        Judgment: Judgment normal.     Diagnostics:  None. Will get spirometry at next office visit  Assessment and Plan: 1. Polyp of nasal cavity   2. Moderate  persistent asthma without complication   3. Seasonal and perennial allergic rhinitis   4. Gastroesophageal reflux disease, unspecified whether esophagitis present     Meds ordered this encounter  Medications   budesonide (PULMICORT) 0.5 MG/2ML nebulizer solution    Sig: Use twice a day as directed with saline rinse    Dispense:  120 mL    Refill:  2    Patient Instructions  Moderate persistent asthma-controlled on samples of Trelegy -Continue Trelegy 200 mcg 1 puff once a day.  Rinse mouth out afterwards.  Continue montelukast 10 mg once a day to prevent cough, wheeze, and shortness of breath Continue albuterol 2 puffs every 4 hours as needed for cough, wheeze, and shortness of breath Continue albuterol 2 puffs 5 to 15 minutes before activity to decrease cough or wheeze Continue Nucala injections every 4 weeks. Consider switching to Dupixent due to nasal polyp  Asthma control goals:  Full participation in all desired activities (may need albuterol before activity) Albuterol use two time or less a week on average (not counting use with activity) Cough interfering with sleep two time or less a month Oral steroids no more than once a year No hospitalizations   Allergic rhinitis with nasal polyp Stop Flonase  Start saline rinse with budesonide twice a day to help with stuffy nose/nasal congestion Fill the NeilMed bottle with 240 cc saline using distilled water and salt mixture.  Then add the entire 2cc vial of budesonide to the rinse bottle and mix together. Demonstration given. Continue to consider ENT referral Continue an antihistamine once a day for a runny nose or itch.Remember to rotate to a different antihistamine about every 3 months. Some examples of over the counter antihistamines include Zyrtec (cetirizine), Xyzal (levocetirizine), Allegra (fexofenadine), and Claritin (loratidine).   You may use nasal saline rinses as needed for nasal symptoms. Use this before using medicated  nasal sprays Continue allergen avoidance measures directed toward pollens, dust mite, mold, pet, and cockroach as listed below  Reflux Continue omeprazole 20 mg once a day as previously prescribed Continue dietary and lifestyle modifications as listed below  Please let us know if this treatment plan is not working for you  Schedule a follow up in 2 months with Dr. Maurine Minister or sooner as needed  Reducing Pollen Exposure The American Academy of Allergy, Asthma and Immunology suggests the following steps to reduce your exposure to pollen during allergy seasons. Do not hang sheets or clothing out to dry; pollen may collect on these items. Do not mow lawns or spend time around freshly cut grass; mowing stirs up pollen. Keep  windows closed at night.  Keep car windows closed while driving. Minimize morning activities outdoors, a time when pollen counts are usually at their highest. Stay indoors as much as possible when pollen counts or humidity is high and on windy days when pollen tends to remain in the air longer. Use air conditioning when possible.  Many air conditioners have filters that trap the pollen spores. Use a HEPA room air filter to remove pollen form the indoor air you breathe.  Control of Mold Allergen Mold and fungi can grow on a variety of surfaces provided certain temperature and moisture conditions exist.  Outdoor molds grow on plants, decaying vegetation and soil.  The major outdoor mold, Alternaria and Cladosporium, are found in very high numbers during hot and dry conditions.  Generally, a late Summer - Fall peak is seen for common outdoor fungal spores.  Rain will temporarily lower outdoor mold spore count, but counts rise rapidly when the rainy period ends.  The most important indoor molds are Aspergillus and Penicillium.  Dark, humid and poorly ventilated basements are ideal sites for mold growth.  The next most common sites of mold growth are the bathroom and the  kitchen.  Outdoor [employer] Use air conditioning and keep windows closed Avoid exposure to decaying vegetation. Avoid leaf raking. Avoid grain handling. Consider wearing a face mask if working in moldy areas.  Indoor Mold Control Maintain humidity below 50%. Clean washable surfaces with 5% bleach solution. Remove sources e.g. Contaminated carpets.   Control of Dust Mite Allergen Dust mites play a major role in allergic asthma and rhinitis. They occur in environments with high humidity wherever human skin is found. Dust mites absorb humidity from the atmosphere (ie, they do not drink) and feed on organic matter (including shed human and animal skin). Dust mites are a microscopic type of insect that you cannot see with the naked eye. High levels of dust mites have been detected from mattresses, pillows, carpets, upholstered furniture, bed covers, clothes, soft toys and any woven material. The principal allergen of the dust mite is found in its feces. A gram of dust may contain 1,000 mites and 250,000 fecal particles. Mite antigen is easily measured in the air during house cleaning activities. Dust mites do not bite and do not cause harm to humans, other than by triggering allergies/asthma.  Ways to decrease your exposure to dust mites in your home:  1. Encase mattresses, box springs and pillows with a mite-impermeable barrier or cover  2. Wash sheets, blankets and drapes weekly in hot water (130 F) with detergent and dry them in a dryer on the hot setting.  3. Have the room cleaned frequently with a vacuum cleaner and a damp dust-mop. For carpeting or rugs, vacuuming with a vacuum cleaner equipped with a high-efficiency particulate air (HEPA) filter. The dust mite allergic individual should not be in a room which is being cleaned and should wait 1 hour after cleaning before going into the room.  4. Do not sleep on upholstered furniture (eg, couches).  5. If possible removing carpeting,  upholstered furniture and drapery from the home is ideal. Horizontal blinds should be eliminated in the rooms where the person spends the most time (bedroom, study, television room). Washable vinyl, roller-type shades are optimal.  6. Remove all non-washable stuffed toys from the bedroom. Wash stuffed toys weekly like sheets and blankets above.  7. Reduce indoor humidity to less than 50%. Inexpensive humidity monitors can be purchased at most hardware  stores. Do not use a humidifier as can make the problem worse and are not recommended.  Control of Cockroach Allergen Cockroach allergen has been identified as an important cause of acute attacks of asthma, especially in urban settings.  There are fifty-five species of cockroach that exist in the [place], however only three, the [place], [place] species produce allergen that can affect patients with Asthma.  Allergens can be obtained from fecal particles, egg casings and secretions from cockroaches.    Remove food sources. Reduce access to water. Seal access and entry points. Spray runways with 0.5-1% Diazinon or Chlorpyrifos Blow boric acid power under stoves and refrigerator. Place bait stations (hydramethylnon) at feeding sites.  Return in about 2 months (around 12/16/2023), or if symptoms worsen or fail to improve.    Thank you for the opportunity to care for this patient.  Please do not hesitate to contact me with questions.  Nehemiah Settle, FNP [facility]

## 2023-10-31 ENCOUNTER — Ambulatory Visit: Payer: 59

## 2023-11-02 ENCOUNTER — Ambulatory Visit: Payer: 59

## 2023-11-10 ENCOUNTER — Ambulatory Visit (INDEPENDENT_AMBULATORY_CARE_PROVIDER_SITE_OTHER)

## 2023-11-10 ENCOUNTER — Telehealth: Payer: Self-pay

## 2023-11-10 DIAGNOSIS — J4550 Severe persistent asthma, uncomplicated: Secondary | ICD-10-CM

## 2023-11-10 NOTE — Telephone Encounter (Signed)
 Noted. Thanks.

## 2023-11-10 NOTE — Telephone Encounter (Signed)
 Called Clifford Williamson at [facility] [phone] budesonite was sent in on 3/10 but patient did not pick up they will get it ready and contact patient for pick up in 30 minutes.. I also called and informed patient of this .

## 2023-11-16 ENCOUNTER — Other Ambulatory Visit: Payer: Self-pay | Admitting: Internal Medicine

## 2023-11-16 NOTE — Telephone Encounter (Signed)
recert

## 2023-12-07 ENCOUNTER — Ambulatory Visit (INDEPENDENT_AMBULATORY_CARE_PROVIDER_SITE_OTHER)

## 2023-12-07 DIAGNOSIS — J4550 Severe persistent asthma, uncomplicated: Secondary | ICD-10-CM

## 2023-12-19 ENCOUNTER — Ambulatory Visit: Admitting: Internal Medicine

## 2023-12-19 DIAGNOSIS — J309 Allergic rhinitis, unspecified: Secondary | ICD-10-CM

## 2024-01-04 ENCOUNTER — Ambulatory Visit: Admitting: Internal Medicine

## 2024-01-04 ENCOUNTER — Ambulatory Visit (INDEPENDENT_AMBULATORY_CARE_PROVIDER_SITE_OTHER)

## 2024-01-04 DIAGNOSIS — J4550 Severe persistent asthma, uncomplicated: Secondary | ICD-10-CM

## 2024-01-11 ENCOUNTER — Telehealth: Payer: Self-pay | Admitting: *Deleted

## 2024-01-11 NOTE — Telephone Encounter (Signed)
 T/C from patient advised change to [employer]  Ins I advised him we do not take this Ins so he is going to change to back to [place]. I advised him to let me know when he gets it reinstated

## 2024-01-15 NOTE — Telephone Encounter (Signed)
 Patient called and l/m for me to do PA with new Ins. I have reached out and advised patient he needs to get Ins card to me by taking by office or uploading to mychart

## 2024-01-31 ENCOUNTER — Encounter: Payer: Self-pay | Admitting: Internal Medicine

## 2024-01-31 ENCOUNTER — Ambulatory Visit (INDEPENDENT_AMBULATORY_CARE_PROVIDER_SITE_OTHER): Admitting: Internal Medicine

## 2024-01-31 VITALS — BP 136/76 | HR 91 | Temp 98.1°F | Resp 18 | Wt 212.5 lb

## 2024-01-31 DIAGNOSIS — K219 Gastro-esophageal reflux disease without esophagitis: Secondary | ICD-10-CM | POA: Diagnosis not present

## 2024-01-31 DIAGNOSIS — H6123 Impacted cerumen, bilateral: Secondary | ICD-10-CM | POA: Diagnosis not present

## 2024-01-31 DIAGNOSIS — J4540 Moderate persistent asthma, uncomplicated: Secondary | ICD-10-CM

## 2024-01-31 DIAGNOSIS — J302 Other seasonal allergic rhinitis: Secondary | ICD-10-CM

## 2024-01-31 DIAGNOSIS — J3089 Other allergic rhinitis: Secondary | ICD-10-CM | POA: Diagnosis not present

## 2024-01-31 MED ORDER — CETIRIZINE HCL 10 MG PO TABS: 10.0000 mg | ORAL_TABLET | Freq: Every day | ORAL | 5 refills | Status: AC | PRN

## 2024-01-31 MED ORDER — OMEPRAZOLE 20 MG PO CPDR: 20.0000 mg | DELAYED_RELEASE_CAPSULE | Freq: Every day | ORAL | 5 refills | Status: DC

## 2024-01-31 MED ORDER — FLUTICASONE PROPIONATE 50 MCG/ACT NA SUSP: 2.0000 | Freq: Every day | NASAL | 5 refills | Status: AC

## 2024-01-31 MED ORDER — TRELEGY ELLIPTA 200-62.5-25 MCG/ACT IN AEPB: INHALATION_SPRAY | RESPIRATORY_TRACT | 5 refills | Status: DC

## 2024-01-31 MED ORDER — ALBUTEROL SULFATE HFA 108 (90 BASE) MCG/ACT IN AERS: 2.0000 | INHALATION_SPRAY | RESPIRATORY_TRACT | 0 refills | Status: DC | PRN

## 2024-01-31 MED ORDER — MONTELUKAST SODIUM 10 MG PO TABS: ORAL_TABLET | ORAL | 5 refills | Status: AC

## 2024-01-31 NOTE — Progress Notes (Signed)
 FOLLOW UP Date of Service/Encounter:   01/31/2024  Subjective:  Clifford Williamson (DOB: Sep 15, 1970) is a 53 y.o. male who [facility] in re-evaluation of the following: seasonal and perennial allergic rhinitis, not well-controlled moderate persistent asthma, and gastroesophageal reflux disease  History obtained from: chart review and patient.  For Review, LV was on 10/16/23 with Wanda Craze, FNP seen for routine follow-up. See below for summary of history and diagnostics.   Therapeutic plans/changes recommended: controlled on Trelegy samples and Nucala . No spirometry preformed.  ----------------------------------------------------- Pertinent History/Diagnostics:  Asthma: Has been on AirDuo 232 in the past. On Xolair , but compliance is an issue. Currently on Nucala -feels Xolair  was more effective. Allergic Rhinitis:  clear rhinorrhea and nasal congestion for which he uses cetirizine  as needed and Flonase  as needed. dog in the home that does not sleep with him. Has reflux controlled on omeprazole 20 mg daiy. - last environmental allergy skin testing was on 09/28/2016 and was positive to pollen, dust mite, mold, pets, and cockroach  --------------------------------------------------- Today presents for follow-up. Discussed the use of AI scribe software for clinical note transcription with the patient, who gave verbal consent to proceed.  History of Present Illness   Clifford Williamson is a 53 year old male with asthma who presents for follow-up of his asthma management.  His asthma is currently better controlled with Trelegy, which he takes every morning, though he occasionally misses doses. He uses his rescue inhaler, albuterol , infrequently, mainly before gym activities. Since his last visit in March, he has not required emergency room visits, urgent care, prednisone , or antibiotics.  He is on Nucala , which was shipped out today, and needs to schedule  an appointment for administration. He prefers Xolair  over Nucala , stating it controlled his asthma better, especially regarding activity level and allergy control. He hopes his current insurance will cover Xolair .  Dupixent has been discussed in the past, but he has also had issues regarding coming in consistently for his injections and there are concerns that an every 2-week dosing will lead to noncompliance.  He continues to take montelukast  and uses Flonase , administering two sprays in each nostril every night. He no longer uses budesonide  rinses. He also takes Allegra  for allergies and has been offered Zyrtec  as an as-needed option.  Regarding his acid reflux, he had symptoms about a month ago. He takes over-the-counter omeprazole 20 mg due to insurance issues but plans to get it through the pharmacy for cost reasons.  He has a history of ear congestion due to wax buildup, which he attempts to manage himself but finds challenging. He is open to using an ear kit to help with this issue.  No frequent use of his rescue inhaler outside of gym activities and no significant nose symptoms. He has not required prednisone  or antibiotics recently.       All medications reviewed by clinical staff and updated in chart. No new pertinent medical or surgical history except as noted in HPI.  ROS: All others negative except as noted per HPI.   Objective:  BP 136/76   Pulse 91   Temp 98.1 F (36.7 C) (Temporal)   Resp 18   Wt 212 lb 8 oz (96.4 kg)   SpO2 99%   BMI 33.78 kg/m  Body mass index is 33.78 kg/m. Physical Exam: General Appearance:  Alert, cooperative, no distress, appears stated age  Head:  Normocephalic, without obvious abnormality, atraumatic  Eyes:  Conjunctiva clear, EOM's intact  Ears TMs not visible due to cerumen impaction bilaterally and EACs normal bilaterally  Nose: Nares normal, hypertrophic turbinates, normal mucosa, and no visible anterior polyps  Throat: Lips, tongue  normal; teeth and gums normal, normal posterior oropharynx  Neck: Supple, symmetrical  Lungs:   clear to auscultation bilaterally, Respirations unlabored, no coughing  Heart:  regular rate and rhythm and no murmur, Appears well perfused  Extremities: No edema  Skin: Skin color, texture, turgor normal and no rashes or lesions on visualized portions of skin  Neurologic: No gross deficits   Labs:  Lab Orders  No laboratory test(s) ordered today    Spirometry:  Tracings reviewed. His effort: Good reproducible efforts. FVC: 2.56L FEV1: 2.11L, 69% predicted FEV1/FVC ratio: 0.82 Interpretation: Nonobstructive ratio, low FEV1, possible restriction.  Please see scanned spirometry results for details.  Assessment/Plan   Moderate persistent asthma-controlled on Trelegy and Nucala , did better on Xolair  Continue Trelegy 200 mcg 1 puff once a day.  Rinse mouth out afterwards.  Continue montelukast  10 mg once a day to prevent cough, wheeze, and shortness of breath Continue albuterol  2 puffs every 4 hours as needed for cough, wheeze, and shortness of breath Continue albuterol  2 puffs 5 to 15 minutes before activity to decrease cough or wheeze Continue Nucala  every 4 weeks. Will speak with Tammy about trying to get you back on Xolair  since you found this more effective.   Asthma control goals:  Full participation in all desired activities (may need albuterol  before activity) Albuterol  use two time or less a week on average (not counting use with activity) Cough interfering with sleep two time or less a month Oral steroids no more than once a year No hospitalizations   Allergic rhinitis with nasal polyp-at goal Continue flonase  2 sprays each nostril nightly. Continue to consider ENT referral Continue an antihistamine once a day for a runny nose or itch.Remember to rotate to a different antihistamine about every 3 months. Some examples of over the counter antihistamines include Zyrtec   (cetirizine ), Xyzal (levocetirizine), Allegra  (fexofenadine ), and Claritin (loratidine).   You may use nasal saline rinses as needed for nasal symptoms. Use this before using medicated nasal sprays Continue allergen avoidance measures directed toward pollens, dust mite, mold, pet, and cockroach as listed below  Reflux-at goal Continue omeprazole 20 mg once a day as previously prescribed Continue dietary and lifestyle modifications as listed below  Ear Wax: at goal - buy over the counter debrox ear drops (or similar) and use per package insert to help loosen and remove ear wax, may take several weeks for wax to dissolve - can use warm water gentle rinses to help dislodge wax after using debrox for a few days   Please let us  know if this treatment plan is not working for you  Schedule a follow up in 6 months, sooner if needed.  Other: none  Rocky Endow, MD  [facility] 

## 2024-01-31 NOTE — Patient Instructions (Addendum)
 Moderate persistent asthma-controlled on Trelegy and Nucala , did better on Xolair  Rinse mouth out afterwards.  Continue montelukast  10 mg once a day to prevent cough, wheeze, and shortness of breath Continue albuterol  2 puffs every 4 hours as needed for cough, wheeze, and shortness of breath Continue albuterol  2 puffs 5 to 15 minutes before activity to decrease cough or wheeze Continue Nucala  every 4 weeks. Will speak with Tammy about trying to get you back on Xolair  since you found this more effective.   Asthma control goals:  Full participation in all desired activities (may need albuterol  before activity) Albuterol  use two time or less a week on average (not counting use with activity) Cough interfering with sleep two time or less a month Oral steroids no more than once a year No hospitalizations   Allergic rhinitis with nasal polyp Continue flonase  2 sprays each nostril nightly. Continue to consider ENT referral Continue an antihistamine once a day for a runny nose or itch.Remember to rotate to a different antihistamine about every 3 months. Some examples of over the counter antihistamines include Zyrtec  (cetirizine ), Xyzal (levocetirizine), Allegra  (fexofenadine ), and Claritin (loratidine).   You may use nasal saline rinses as needed for nasal symptoms. Use this before using medicated nasal sprays Continue allergen avoidance measures directed toward pollens, dust mite, mold, pet, and cockroach as listed below  Reflux Continue omeprazole 20 mg once a day as previously prescribed Continue dietary and lifestyle modifications as listed below  Ear Wax:  - buy over the counter debrox ear drops (or similar) and use per package insert to help loosen and remove ear wax, may take several weeks for wax to dissolve - can use warm water gentle rinses to help dislodge wax after using debrox for a few days   Please let us  know if this treatment plan is not working for you  Schedule a follow up  in 6 months, sooner if needed.  Reducing Pollen Exposure The American Academy of Allergy, Asthma and Immunology suggests the following steps to reduce your exposure to pollen during allergy seasons. Do not hang sheets or clothing out to dry; pollen may collect on these items. Do not mow lawns or spend time around freshly cut grass; mowing stirs up pollen. Keep windows closed at night.  Keep car windows closed while driving. Minimize morning activities outdoors, a time when pollen counts are usually at their highest. Stay indoors as much as possible when pollen counts or humidity is high and on windy days when pollen tends to remain in the air longer. Use air conditioning when possible.  Many air conditioners have filters that trap the pollen spores. Use a HEPA room air filter to remove pollen form the indoor air you breathe.  Control of Mold Allergen Mold and fungi can grow on a variety of surfaces provided certain temperature and moisture conditions exist.  Outdoor molds grow on plants, decaying vegetation and soil.  The major outdoor mold, Alternaria and Cladosporium, are found in very high numbers during hot and dry conditions.  Generally, a late Summer - Fall peak is seen for common outdoor fungal spores.  Rain will temporarily lower outdoor mold spore count, but counts rise rapidly when the rainy period ends.  The most important indoor molds are Aspergillus and Penicillium.  Dark, humid and poorly ventilated basements are ideal sites for mold growth.  The next most common sites of mold growth are the bathroom and the kitchen.  Outdoor [employer] Use air conditioning and keep windows  closed Avoid exposure to decaying vegetation. Avoid leaf raking. Avoid grain handling. Consider wearing a face mask if working in moldy areas.  Indoor Mold Control Maintain humidity below 50%. Clean washable surfaces with 5% bleach solution. Remove sources e.g. Contaminated carpets.   Control of Dust  Mite Allergen Dust mites play a major role in allergic asthma and rhinitis. They occur in environments with high humidity wherever human skin is found. Dust mites absorb humidity from the atmosphere (ie, they do not drink) and feed on organic matter (including shed human and animal skin). Dust mites are a microscopic type of insect that you cannot see with the naked eye. High levels of dust mites have been detected from mattresses, pillows, carpets, upholstered furniture, bed covers, clothes, soft toys and any woven material. The principal allergen of the dust mite is found in its feces. A gram of dust may contain 1,000 mites and 250,000 fecal particles. Mite antigen is easily measured in the air during house cleaning activities. Dust mites do not bite and do not cause harm to humans, other than by triggering allergies/asthma.  Ways to decrease your exposure to dust mites in your home:  1. Encase mattresses, box springs and pillows with a mite-impermeable barrier or cover  2. Wash sheets, blankets and drapes weekly in hot water (130 F) with detergent and dry them in a dryer on the hot setting.  3. Have the room cleaned frequently with a vacuum cleaner and a damp dust-mop. For carpeting or rugs, vacuuming with a vacuum cleaner equipped with a high-efficiency particulate air (HEPA) filter. The dust mite allergic individual should not be in a room which is being cleaned and should wait 1 hour after cleaning before going into the room.  4. Do not sleep on upholstered furniture (eg, couches).  5. If possible removing carpeting, upholstered furniture and drapery from the home is ideal. Horizontal blinds should be eliminated in the rooms where the person spends the most time (bedroom, study, television room). Washable vinyl, roller-type shades are optimal.  6. Remove all non-washable stuffed toys from the bedroom. Wash stuffed toys weekly like sheets and blankets above.  7. Reduce indoor humidity to less  than 50%. Inexpensive humidity monitors can be purchased at most hardware stores. Do not use a humidifier as can make the problem worse and are not recommended.  Control of Cockroach Allergen Cockroach allergen has been identified as an important cause of acute attacks of asthma, especially in urban settings.  There are fifty-five species of cockroach that exist in the [place] , however only three, the [place], [place] and [place] species produce allergen that can affect patients with Asthma.  Allergens can be obtained from fecal particles, egg casings and secretions from cockroaches.    Remove food sources. Reduce access to water. Seal access and entry points. Spray runways with 0.5-1% Diazinon or Chlorpyrifos Blow boric acid power under stoves and refrigerator. Place bait stations (hydramethylnon) at feeding sites.

## 2024-02-02 ENCOUNTER — Ambulatory Visit

## 2024-02-05 ENCOUNTER — Other Ambulatory Visit (HOSPITAL_COMMUNITY): Payer: Self-pay

## 2024-02-05 ENCOUNTER — Other Ambulatory Visit: Payer: Self-pay

## 2024-02-05 ENCOUNTER — Telehealth: Payer: Self-pay | Admitting: *Deleted

## 2024-02-05 MED ORDER — OMALIZUMAB 75 MG/0.5ML ~~LOC~~ SOSY
375.0000 mg | PREFILLED_SYRINGE | SUBCUTANEOUS | 28 refills | Status: AC
  Filled 2024-02-07: qty 0.5, 14d supply, fill #0
  Filled 2024-02-19: qty 0.5, 14d supply, fill #1
  Filled 2024-02-28 – 2024-03-13 (×2): qty 0.5, 14d supply, fill #2
  Filled 2024-03-26: qty 0.5, 14d supply, fill #3
  Filled 2024-04-04: qty 0.5, 14d supply, fill #4
  Filled 2024-04-22: qty 0.5, 14d supply, fill #5
  Filled 2024-05-22: qty 0.5, 14d supply, fill #6
  Filled 2024-06-05 – 2024-06-11 (×3): qty 0.5, 14d supply, fill #7
  Filled 2024-06-28: qty 0.5, 14d supply, fill #8
  Filled 2024-07-11: qty 0.5, 14d supply, fill #9
  Filled 2024-07-31: qty 0.5, 14d supply, fill #10
  Filled 2024-08-16: qty 0.5, 14d supply, fill #11
  Filled 2024-09-03: qty 0.5, 14d supply, fill #12
  Filled 2024-09-11: qty 0.5, 14d supply, fill #13

## 2024-02-05 MED ORDER — XOLAIR 300 MG/2ML ~~LOC~~ SOSY
375.0000 mg | PREFILLED_SYRINGE | SUBCUTANEOUS | 11 refills | Status: AC
  Filled 2024-02-07: qty 4, 28d supply, fill #0
  Filled 2024-02-28 – 2024-03-13 (×2): qty 4, 28d supply, fill #1
  Filled 2024-04-04: qty 4, 28d supply, fill #2
  Filled 2024-05-01: qty 4, 28d supply, fill #3
  Filled 2024-06-05 – 2024-06-11 (×4): qty 4, 28d supply, fill #4
  Filled 2024-07-11: qty 4, 28d supply, fill #5
  Filled 2024-08-12 – 2024-08-16 (×2): qty 4, 28d supply, fill #6
  Filled 2024-09-11: qty 4, 28d supply, fill #7

## 2024-02-05 NOTE — Telephone Encounter (Signed)
 L/m for patient for patient to call to advise change back to Xolair  375mg  every 14 days for asthma control

## 2024-02-05 NOTE — Telephone Encounter (Signed)
 L/m for patient Clifford Williamson run the Xolair  and it is showing express scripts approval needed. Advised him to contact [facility] and advise only coverage or get me the primary coverage for approval and submit

## 2024-02-05 NOTE — Telephone Encounter (Signed)
 Advised patient change from Nucala  to Xolair  sent to [facility] will reach out once delivery set to make appt to restart Xolair 

## 2024-02-05 NOTE — Telephone Encounter (Signed)
-----   Message from Rocky LOISE Endow sent at 01/31/2024  1:11 PM EDT ----- Hi Clifford Williamson-he wants to know if he can get back on Xolair ?  He felt better controlled on that versus Nucala .

## 2024-02-07 ENCOUNTER — Other Ambulatory Visit (HOSPITAL_COMMUNITY): Payer: Self-pay

## 2024-02-07 ENCOUNTER — Encounter (HOSPITAL_COMMUNITY): Payer: Self-pay

## 2024-02-07 ENCOUNTER — Other Ambulatory Visit: Payer: Self-pay

## 2024-02-07 NOTE — Progress Notes (Signed)
 Specialty Pharmacy Initial Fill Coordination Note  Clifford Williamson is a 53 y.o. male contacted today regarding initial fill of specialty medication(s) Omalizumab  (XOLAIR )   Patient requested Courier to Provider Office   Delivery date: 02/12/24   Verified address: [facility]   Medication will be filled on 7/3.   Patient is aware of $8 copayment.

## 2024-02-07 NOTE — Progress Notes (Signed)
 Specialty Pharmacy Initiation Note   Clifford Williamson is a 53 y.o. male who will be followed by the specialty pharmacy service for RxSp Asthma/COPD    Review of administration, indication, effectiveness, safety, potential side effects, storage/disposable, and missed dose instructions occurred today for patient's specialty medication(s) Omalizumab  (XOLAIR )     Patient/Caregiver did not have any additional questions or concerns.   Patient's therapy is appropriate to: Initiate    Goals Addressed             This Visit's Progress    Reduce signs and symptoms       Patient is initiating therapy. Patient will maintain adherence and adhere to provider and/or lab appointments         [facility] Specialty Pharmacist

## 2024-02-08 ENCOUNTER — Other Ambulatory Visit: Payer: Self-pay

## 2024-02-13 NOTE — Telephone Encounter (Signed)
 L/M for patient Xolair  delivered to [facility] can call to restart same

## 2024-02-14 ENCOUNTER — Other Ambulatory Visit: Payer: Self-pay

## 2024-02-15 ENCOUNTER — Ambulatory Visit

## 2024-02-15 DIAGNOSIS — J4550 Severe persistent asthma, uncomplicated: Secondary | ICD-10-CM | POA: Diagnosis not present

## 2024-02-15 NOTE — Progress Notes (Signed)
 Immunotherapy   Patient Details  Name: Clifford Williamson MRN: [id number] Date of Birth: 02-19-1971  02/15/2024  Clifford Williamson started injections for  Xolair  375 mg   Frequency: Q 2 weeks Epi-Pen:Epi-Pen Available  Consent signed and patient instructions given. No problems after 15 minute wait. (Ok per Dr. Lorin to only wait 15 mins)   Clifford Williamson 02/15/2024, 2:26 PM

## 2024-02-16 ENCOUNTER — Ambulatory Visit: Admitting: Internal Medicine

## 2024-02-19 ENCOUNTER — Other Ambulatory Visit: Payer: Self-pay

## 2024-02-19 NOTE — Progress Notes (Signed)
 Specialty Pharmacy Refill Coordination Note  Clifford Williamson is a 53 y.o. male contacted today regarding refills of specialty medication(s) Omalizumab  (XOLAIR )   Patient requested Courier to Provider Office   Delivery date: 02/22/24   Verified address: [facility]   Medication will be filled on 07.16.25.

## 2024-02-19 NOTE — Addendum Note (Signed)
 Addended by: OTHA MADELIN HERO on: 02/19/2024 04:11 PM   Modules accepted: Orders

## 2024-02-20 ENCOUNTER — Other Ambulatory Visit: Payer: Self-pay

## 2024-02-28 ENCOUNTER — Other Ambulatory Visit: Payer: Self-pay

## 2024-02-29 ENCOUNTER — Other Ambulatory Visit (HOSPITAL_COMMUNITY): Payer: Self-pay

## 2024-02-29 ENCOUNTER — Ambulatory Visit

## 2024-02-29 DIAGNOSIS — J4550 Severe persistent asthma, uncomplicated: Secondary | ICD-10-CM

## 2024-02-29 MED ORDER — OMALIZUMAB 300 MG/2  ML ~~LOC~~ SOSY
375.0000 mg | PREFILLED_SYRINGE | SUBCUTANEOUS | Status: AC
  Administered 2024-02-29 – 2024-09-11 (×14): 375 mg via SUBCUTANEOUS

## 2024-03-01 ENCOUNTER — Other Ambulatory Visit (HOSPITAL_COMMUNITY): Payer: Self-pay

## 2024-03-08 ENCOUNTER — Other Ambulatory Visit: Payer: Self-pay

## 2024-03-13 ENCOUNTER — Other Ambulatory Visit: Payer: Self-pay

## 2024-03-13 NOTE — Progress Notes (Signed)
 Specialty Pharmacy Refill Coordination Note  Clifford Williamson is a 53 y.o. male contacted today regarding refills of specialty medication(s) Omalizumab  (XOLAIR )   Patient requested Courier to Provider Office   Delivery date: 03/18/24   Verified address: [facility]   Medication will be filled on 08.08.25.

## 2024-03-14 ENCOUNTER — Other Ambulatory Visit: Payer: Self-pay

## 2024-03-15 ENCOUNTER — Other Ambulatory Visit: Payer: Self-pay

## 2024-03-15 NOTE — Progress Notes (Signed)
 LVM for patient need a new form of payment. Card on file declined VISA ending in 2422 EXP date 08/25. Added copays to AR account. Since appt is Tuesday 8/12

## 2024-03-19 ENCOUNTER — Other Ambulatory Visit: Payer: Self-pay | Admitting: Internal Medicine

## 2024-03-19 ENCOUNTER — Ambulatory Visit

## 2024-03-19 DIAGNOSIS — J4550 Severe persistent asthma, uncomplicated: Secondary | ICD-10-CM

## 2024-03-22 ENCOUNTER — Other Ambulatory Visit: Payer: Self-pay

## 2024-03-26 ENCOUNTER — Other Ambulatory Visit: Payer: Self-pay

## 2024-03-26 NOTE — Progress Notes (Signed)
 Specialty Pharmacy Refill Coordination Note  Clifford Williamson is a 53 y.o. male assessed today regarding refills of clinic administered specialty medication(s) Omalizumab  (XOLAIR ) (75MG )   Clinic requested Courier to Provider Office   Delivery date: 03/28/24   Verified address: [facility]   Medication will be filled on 03/27/24.   LVM and sent MyChart message to update patient cc on file.

## 2024-04-02 ENCOUNTER — Ambulatory Visit (INDEPENDENT_AMBULATORY_CARE_PROVIDER_SITE_OTHER)

## 2024-04-02 DIAGNOSIS — J4550 Severe persistent asthma, uncomplicated: Secondary | ICD-10-CM | POA: Diagnosis not present

## 2024-04-03 ENCOUNTER — Other Ambulatory Visit: Payer: Self-pay

## 2024-04-04 ENCOUNTER — Other Ambulatory Visit: Payer: Self-pay

## 2024-04-04 ENCOUNTER — Other Ambulatory Visit (HOSPITAL_COMMUNITY): Payer: Self-pay

## 2024-04-04 NOTE — Progress Notes (Signed)
 Specialty Pharmacy Refill Coordination Note  Clifford Williamson is a 53 y.o. male contacted today regarding refills of specialty medication(s) Omalizumab  (XOLAIR )   Patient requested Courier to Provider Office   Delivery date: 04/11/24   Verified address: [facility]   Medication will be filled on 04/10/24.

## 2024-04-10 ENCOUNTER — Other Ambulatory Visit: Payer: Self-pay

## 2024-04-17 ENCOUNTER — Ambulatory Visit

## 2024-04-17 DIAGNOSIS — J4550 Severe persistent asthma, uncomplicated: Secondary | ICD-10-CM | POA: Diagnosis not present

## 2024-04-22 ENCOUNTER — Other Ambulatory Visit: Payer: Self-pay

## 2024-04-24 ENCOUNTER — Other Ambulatory Visit: Payer: Self-pay

## 2024-04-24 ENCOUNTER — Other Ambulatory Visit: Payer: Self-pay | Admitting: Pharmacy Technician

## 2024-04-24 NOTE — Progress Notes (Signed)
 Specialty Pharmacy Refill Coordination Note  Clifford Williamson is a 53 y.o. male contacted today regarding refills of specialty medication(s) Omalizumab  (XOLAIR )   Patient requested Courier to Provider Office   Delivery date: 04/25/24   Verified address: [facility] [address] [place], [place] [address]   Medication will be filled on 04/24/24. Appointment 05/01/24. Co-Pay $4.00

## 2024-05-01 ENCOUNTER — Other Ambulatory Visit: Payer: Self-pay

## 2024-05-01 ENCOUNTER — Ambulatory Visit

## 2024-05-01 NOTE — Progress Notes (Signed)
 Specialty Pharmacy Refill Coordination Note  Clifford Williamson is a 53 y.o. male contacted today regarding refills of specialty medication(s) Omalizumab  (XOLAIR )   Patient requested Courier to Provider Office   Delivery date: 05/02/24   Verified address: [facility] [address] [place], [place] [address]   Medication will be filled on 05/01/24.

## 2024-05-06 ENCOUNTER — Ambulatory Visit

## 2024-05-07 ENCOUNTER — Ambulatory Visit

## 2024-05-07 DIAGNOSIS — J4550 Severe persistent asthma, uncomplicated: Secondary | ICD-10-CM

## 2024-05-21 ENCOUNTER — Telehealth: Payer: Self-pay

## 2024-05-21 ENCOUNTER — Ambulatory Visit (INDEPENDENT_AMBULATORY_CARE_PROVIDER_SITE_OTHER)

## 2024-05-21 DIAGNOSIS — J4550 Severe persistent asthma, uncomplicated: Secondary | ICD-10-CM

## 2024-05-21 NOTE — Telephone Encounter (Signed)
 Pt was due for xolair  but we only had 300 mg/2 ml in here. Pt has another one of this dose but none of the 75 mg/0.5 ml. Had to use sample of 75 mg. Please review. Next appt schedule for 10/28.

## 2024-05-22 ENCOUNTER — Other Ambulatory Visit: Payer: Self-pay

## 2024-05-22 ENCOUNTER — Other Ambulatory Visit (HOSPITAL_COMMUNITY): Payer: Self-pay

## 2024-05-22 NOTE — Progress Notes (Signed)
 Specialty Pharmacy Refill Coordination Note  Clifford Williamson is a 53 y.o. male contacted today regarding refills of specialty medication(s) Omalizumab  (XOLAIR ) (75mg  inj only this time)  Patient requested Courier to Provider Office   Delivery date: 05/30/24   Verified address: [facility] [address], [place], [place] [address]   Medication will be filled on 05/29/24.

## 2024-05-28 ENCOUNTER — Other Ambulatory Visit: Payer: Self-pay

## 2024-05-29 ENCOUNTER — Other Ambulatory Visit: Payer: Self-pay

## 2024-06-04 ENCOUNTER — Ambulatory Visit

## 2024-06-05 ENCOUNTER — Ambulatory Visit (INDEPENDENT_AMBULATORY_CARE_PROVIDER_SITE_OTHER)

## 2024-06-05 ENCOUNTER — Telehealth: Payer: Self-pay

## 2024-06-05 ENCOUNTER — Ambulatory Visit

## 2024-06-05 ENCOUNTER — Other Ambulatory Visit: Payer: Self-pay

## 2024-06-05 DIAGNOSIS — J4550 Severe persistent asthma, uncomplicated: Secondary | ICD-10-CM | POA: Diagnosis not present

## 2024-06-05 NOTE — Telephone Encounter (Signed)
 Left message for patient to return call to office.

## 2024-06-05 NOTE — Telephone Encounter (Signed)
 Patient came in today for his Xolair  and wanted to talk to you about an issue with his medication. He is having frequent urination and thinks it is coming from the Trelegy he is taking. Please advise.

## 2024-06-05 NOTE — Telephone Encounter (Signed)
 It can cause urinary retention and that could potentially lead to overflow. We can try stepping down to just Symbicort  160 mcg 2 puffs BID. Stop Trelegy and avoid spiriva. Please send Symbicort  to the pharmacy.

## 2024-06-06 MED ORDER — BUDESONIDE-FORMOTEROL FUMARATE 160-4.5 MCG/ACT IN AERO: 2.0000 | INHALATION_SPRAY | Freq: Two times a day (BID) | RESPIRATORY_TRACT | 12 refills | Status: AC

## 2024-06-06 NOTE — Addendum Note (Signed)
 Addended by: NORA SAM on: 06/06/2024 03:27 PM   Modules accepted: Orders

## 2024-06-06 NOTE — Telephone Encounter (Signed)
 Spoke with Clifford Williamson and verbalized understanding and aware sending symbicort  160 mcg 2 puffs daily to pharmacy.

## 2024-06-11 ENCOUNTER — Other Ambulatory Visit: Payer: Self-pay

## 2024-06-11 ENCOUNTER — Other Ambulatory Visit: Payer: Self-pay | Admitting: Family

## 2024-06-11 ENCOUNTER — Other Ambulatory Visit (HOSPITAL_COMMUNITY): Payer: Self-pay

## 2024-06-11 NOTE — Progress Notes (Signed)
 Specialty Pharmacy Refill Coordination Note  Clifford Williamson is a 53 y.o. male assessed today regarding refills of clinic administered specialty medication(s) Omalizumab  (XOLAIR )   Clinic requested Courier to Provider Office   Delivery date: 06/13/24   Verified address: [facility] [address] [place], [place] [address]   Medication will be filled on: 06/12/24  Filling 75mg  and 300mg 

## 2024-06-12 ENCOUNTER — Other Ambulatory Visit: Payer: Self-pay

## 2024-06-13 ENCOUNTER — Telehealth: Payer: Self-pay

## 2024-06-13 ENCOUNTER — Other Ambulatory Visit (HOSPITAL_COMMUNITY): Payer: Self-pay

## 2024-06-13 NOTE — Telephone Encounter (Signed)
*  [facility]  Pharmacy Patient Advocate Encounter   Received notification from Fax that prior authorization for Trelegy is required/requested.   Insurance verification completed.   The patient is insured through [facility].   Per test claim: PA required; PA started via CoverMyMeds. KEY [id number] . Please see clinical question(s) below that I am not finding the answer to in their chart and advise.    Please clarify what inhaler the patient is supposed to be taking, last notes state that Symbicort  was being sent in but a more recent order of Trelegy has been sent in.

## 2024-06-17 ENCOUNTER — Ambulatory Visit

## 2024-06-18 ENCOUNTER — Other Ambulatory Visit: Payer: Self-pay

## 2024-06-19 ENCOUNTER — Other Ambulatory Visit: Payer: Self-pay

## 2024-06-20 ENCOUNTER — Other Ambulatory Visit: Payer: Self-pay

## 2024-06-20 NOTE — Telephone Encounter (Addendum)
  Prior Shara has been sent to [employer] for [employer]. Waiting for determination.   The [employer] is reviewing your PA request. Typically an electronic response will be received within 24-72 hours

## 2024-06-24 NOTE — Telephone Encounter (Signed)
 Trelegy PA has been denied. Preferred alternative is Advair HFA/Diskus or Dulera .

## 2024-06-24 NOTE — Telephone Encounter (Signed)
 Can we send in Dulera  200 2 puffs BID and add spiriva 1.25 mcg 2 puffs daily

## 2024-06-25 MED ORDER — SPIRIVA RESPIMAT 1.25 MCG/ACT IN AERS: 2.0000 | INHALATION_SPRAY | Freq: Every day | RESPIRATORY_TRACT | 1 refills | Status: DC

## 2024-06-25 MED ORDER — DULERA 200-5 MCG/ACT IN AERO: 2.0000 | INHALATION_SPRAY | Freq: Two times a day (BID) | RESPIRATORY_TRACT | 1 refills | Status: AC

## 2024-06-25 NOTE — Telephone Encounter (Signed)
 Tried calling no answer, left voicemail for a return call.   New medications sent to [facility] in [place]- need to inform him.

## 2024-06-26 ENCOUNTER — Ambulatory Visit (INDEPENDENT_AMBULATORY_CARE_PROVIDER_SITE_OTHER)

## 2024-06-26 DIAGNOSIS — J4550 Severe persistent asthma, uncomplicated: Secondary | ICD-10-CM

## 2024-06-27 NOTE — Telephone Encounter (Signed)
 Pt has been informed of change by [facility].

## 2024-06-28 ENCOUNTER — Other Ambulatory Visit: Payer: Self-pay

## 2024-06-28 NOTE — Progress Notes (Signed)
 Specialty Pharmacy Refill Coordination Note  Clifford Williamson is a 53 y.o. male assessed today regarding refills of clinic administered specialty medication(s) Omalizumab  (XOLAIR )   Clinic requested Courier to Provider Office   Delivery date: 07/02/24   Verified address: [facility] [address] [place], [place] [address]   Medication will be filled on: 07/01/24  XOLAIR  75 MG/0.5ML to be filled.

## 2024-07-01 ENCOUNTER — Other Ambulatory Visit: Payer: Self-pay

## 2024-07-02 ENCOUNTER — Other Ambulatory Visit: Payer: Self-pay

## 2024-07-10 ENCOUNTER — Ambulatory Visit

## 2024-07-10 DIAGNOSIS — J4550 Severe persistent asthma, uncomplicated: Secondary | ICD-10-CM | POA: Diagnosis not present

## 2024-07-11 ENCOUNTER — Other Ambulatory Visit: Payer: Self-pay

## 2024-07-11 NOTE — Progress Notes (Signed)
 Specialty Pharmacy Refill Coordination Note  Clifford Williamson is a 53 y.o. male assessed today regarding refills of clinic administered specialty medication(s) Omalizumab  (XOLAIR )   Clinic requested Courier to Provider Office   Delivery date: 07/18/24   Verified address: [facility] [address] [place], [place] [address]   Medication will be filled on: 07/17/24  Appointment 07/23/24.  Both 300 and 75 mg filled.

## 2024-07-15 ENCOUNTER — Other Ambulatory Visit: Payer: Self-pay

## 2024-07-17 ENCOUNTER — Other Ambulatory Visit: Payer: Self-pay

## 2024-07-23 ENCOUNTER — Ambulatory Visit

## 2024-07-24 ENCOUNTER — Ambulatory Visit

## 2024-07-25 ENCOUNTER — Ambulatory Visit

## 2024-07-25 DIAGNOSIS — J4550 Severe persistent asthma, uncomplicated: Secondary | ICD-10-CM

## 2024-07-26 ENCOUNTER — Other Ambulatory Visit (HOSPITAL_COMMUNITY): Payer: Self-pay

## 2024-07-31 ENCOUNTER — Other Ambulatory Visit: Payer: Self-pay | Admitting: Internal Medicine

## 2024-07-31 ENCOUNTER — Other Ambulatory Visit: Payer: Self-pay

## 2024-07-31 NOTE — Progress Notes (Signed)
 Specialty Pharmacy Refill Coordination Note  Clifford Williamson is a 53 y.o. male assessed today regarding refills of clinic administered specialty medication(s) Omalizumab  (XOLAIR )   Clinic requested Courier to Provider Office   Delivery date: 08/06/24   Verified address: [facility] [address] [place], [place] [address]   Medication will be filled on: 08/05/24

## 2024-08-05 ENCOUNTER — Other Ambulatory Visit: Payer: Self-pay

## 2024-08-12 ENCOUNTER — Other Ambulatory Visit: Payer: Self-pay

## 2024-08-13 ENCOUNTER — Ambulatory Visit

## 2024-08-14 ENCOUNTER — Ambulatory Visit (INDEPENDENT_AMBULATORY_CARE_PROVIDER_SITE_OTHER)

## 2024-08-14 DIAGNOSIS — J4550 Severe persistent asthma, uncomplicated: Secondary | ICD-10-CM

## 2024-08-16 ENCOUNTER — Other Ambulatory Visit (HOSPITAL_COMMUNITY): Payer: Self-pay

## 2024-08-16 ENCOUNTER — Other Ambulatory Visit: Payer: Self-pay

## 2024-08-16 NOTE — Progress Notes (Signed)
 Specialty Pharmacy Refill Coordination Note  Clifford Williamson is a 54 y.o. male assessed today regarding refills of clinic administered specialty medication(s) Omalizumab  (XOLAIR )   Clinic requested Courier to Provider Office   Delivery date: 08/22/24   Verified address: [facility] - [address] [place], [place] [address]   Medication will be filled on: 08/21/24

## 2024-08-16 NOTE — Progress Notes (Signed)
 Patient's refill has been scheduled in [place].

## 2024-08-20 ENCOUNTER — Other Ambulatory Visit: Payer: Self-pay

## 2024-08-21 ENCOUNTER — Other Ambulatory Visit: Payer: Self-pay

## 2024-08-28 ENCOUNTER — Ambulatory Visit

## 2024-08-28 ENCOUNTER — Other Ambulatory Visit (HOSPITAL_COMMUNITY): Payer: Self-pay

## 2024-08-28 DIAGNOSIS — J4550 Severe persistent asthma, uncomplicated: Secondary | ICD-10-CM | POA: Diagnosis not present

## 2024-08-30 ENCOUNTER — Other Ambulatory Visit (HOSPITAL_COMMUNITY): Payer: Self-pay

## 2024-08-30 ENCOUNTER — Other Ambulatory Visit: Payer: Self-pay | Admitting: Internal Medicine

## 2024-09-03 ENCOUNTER — Other Ambulatory Visit: Payer: Self-pay

## 2024-09-03 NOTE — Progress Notes (Signed)
 Specialty Pharmacy Refill Coordination Note  Clifford Williamson is a 54 y.o. male assessed today regarding refills of clinic administered specialty medication(s) Omalizumab  (XOLAIR ) (75mg  only this fill)   Clinic requested Courier to Provider Office   Delivery date: 09/05/24   Verified address: [facility] - [address] [place], [place] [address]   Medication will be filled on: 09/04/24

## 2024-09-04 ENCOUNTER — Other Ambulatory Visit: Payer: Self-pay

## 2024-09-11 ENCOUNTER — Ambulatory Visit

## 2024-09-11 ENCOUNTER — Other Ambulatory Visit: Payer: Self-pay

## 2024-09-11 DIAGNOSIS — J4550 Severe persistent asthma, uncomplicated: Secondary | ICD-10-CM

## 2024-09-25 ENCOUNTER — Ambulatory Visit
